# Patient Record
Sex: Male | Born: 1990 | Hispanic: No | Smoking: Former smoker
Health system: Southern US, Community
[De-identification: ages and names within clinical notes are randomized; demographics above are authoritative.]

## PROBLEM LIST (undated history)

## (undated) DIAGNOSIS — F250 Schizoaffective disorder, bipolar type: Secondary | ICD-10-CM

## (undated) DIAGNOSIS — F259 Schizoaffective disorder, unspecified: Secondary | ICD-10-CM

---

## 2017-01-09 ENCOUNTER — Other Ambulatory Visit: Payer: Self-pay

## 2017-01-09 ENCOUNTER — Encounter (HOSPITAL_COMMUNITY): Payer: Self-pay | Admitting: Emergency Medicine

## 2017-01-09 DIAGNOSIS — F1210 Cannabis abuse, uncomplicated: Secondary | ICD-10-CM | POA: Insufficient documentation

## 2017-01-09 DIAGNOSIS — Z139 Encounter for screening, unspecified: Secondary | ICD-10-CM | POA: Insufficient documentation

## 2017-01-09 DIAGNOSIS — Z87891 Personal history of nicotine dependence: Secondary | ICD-10-CM | POA: Insufficient documentation

## 2017-01-09 LAB — CBC
HCT: 41.9 % (ref 39.0–52.0)
Hemoglobin: 15.4 g/dL (ref 13.0–17.0)
MCH: 29.9 pg (ref 26.0–34.0)
MCHC: 36.8 g/dL — AB (ref 30.0–36.0)
MCV: 81.4 fL (ref 78.0–100.0)
PLATELETS: 263 10*3/uL (ref 150–400)
RBC: 5.15 MIL/uL (ref 4.22–5.81)
RDW: 12.1 % (ref 11.5–15.5)
WBC: 8.2 10*3/uL (ref 4.0–10.5)

## 2017-01-09 LAB — RAPID URINE DRUG SCREEN, HOSP PERFORMED
Amphetamines: NOT DETECTED
BARBITURATES: NOT DETECTED
BENZODIAZEPINES: NOT DETECTED
COCAINE: NOT DETECTED
OPIATES: NOT DETECTED
Tetrahydrocannabinol: NOT DETECTED

## 2017-01-09 LAB — COMPREHENSIVE METABOLIC PANEL
ALT: 15 U/L — AB (ref 17–63)
AST: 24 U/L (ref 15–41)
Albumin: 4.5 g/dL (ref 3.5–5.0)
Alkaline Phosphatase: 68 U/L (ref 38–126)
Anion gap: 10 (ref 5–15)
BILIRUBIN TOTAL: 0.8 mg/dL (ref 0.3–1.2)
BUN: 19 mg/dL (ref 6–20)
CALCIUM: 9.2 mg/dL (ref 8.9–10.3)
CO2: 25 mmol/L (ref 22–32)
CREATININE: 1.08 mg/dL (ref 0.61–1.24)
Chloride: 100 mmol/L — ABNORMAL LOW (ref 101–111)
Glucose, Bld: 100 mg/dL — ABNORMAL HIGH (ref 65–99)
Potassium: 3.5 mmol/L (ref 3.5–5.1)
Sodium: 135 mmol/L (ref 135–145)
TOTAL PROTEIN: 7.4 g/dL (ref 6.5–8.1)

## 2017-01-09 LAB — ETHANOL

## 2017-01-09 NOTE — ED Triage Notes (Addendum)
Patient presents to ED for a psychiatric evaluation. Patient states he is homeless and after talking to his friends, reports they suggested he talk to a professional. Patient calm and cooperative. Denies SI/HI. Patient states that he doesn't understand people the way a normal person would. He is very reflective on how he perceives things differently, but goes off on multiple tangents to where it no longer makes sense (flight of ideas).

## 2017-01-10 ENCOUNTER — Emergency Department (HOSPITAL_COMMUNITY)
Admission: EM | Admit: 2017-01-10 | Discharge: 2017-01-10 | Disposition: A | Discharge status: Home / Self Care | Payer: Self-pay | Attending: Emergency Medicine | Admitting: Emergency Medicine

## 2017-01-10 DIAGNOSIS — Z139 Encounter for screening, unspecified: Secondary | ICD-10-CM

## 2017-01-10 NOTE — ED Provider Notes (Signed)
[facility] Provider Note   CSN: [id number] Arrival date & time: 01/09/17  2203     History   Chief Complaint Chief Complaint  Patient presents with  . Psychiatric Evaluation    HPI Race Jordan Gomez is a 26 y.o. otherwise healthy male who presents to the ED requesting counseling services.  Patient states that his friends recommended that he get counseling, so he came here for psychiatric evaluation.  Patient denies any SI, HI, AVH, alcohol use, drug use, or cigarette use.  He has never seen a therapist before aside from when he was in school and had counseling through school.  He has never been diagnosed with any psychiatric conditions that he is aware of.  He denies any medical complaints, or any other complaints at this time. He came just because his friends thought he should get "checked" for his psychiatric wellbeing.    The history is provided by the patient and medical records. No language interpreter was used.    History reviewed. No pertinent past medical history.  There are no active problems to display for this patient.   History reviewed. No pertinent surgical history.     Home Medications    Prior to Admission medications   Not on File    Family History No family history on file.  Social History Social History   Tobacco Use  . Smoking status: Former Smoker    Types: Cigarettes  . Smokeless tobacco: Never Used  Substance Use Topics  . Alcohol use: Yes    Frequency: Never    Comment: occ  . Drug use: Yes    Types: Marijuana    Comment: every now and then     Allergies   Patient has no known allergies.   Review of Systems Review of Systems  Constitutional: Negative for chills and fever.  Respiratory: Negative for shortness of breath.   Cardiovascular: Negative for chest pain.  Gastrointestinal: Negative for abdominal pain, constipation, diarrhea, nausea and vomiting.  Genitourinary: Negative for  dysuria and hematuria.  Musculoskeletal: Negative for arthralgias and myalgias.  Skin: Negative for color change.  Allergic/Immunologic: Negative for immunocompromised state.  Neurological: Negative for weakness and numbness.  Psychiatric/Behavioral: Negative for confusion, hallucinations and suicidal ideas.   All other systems reviewed and are negative for acute change except as noted in the HPI.    Physical Exam Updated Vital Signs BP 112/71 (BP Location: Right Arm)   Pulse 86   Temp 98 F (36.7 C) (Oral)   Resp 18   SpO2 97%   Physical Exam  Constitutional: He is oriented to person, place, and time. Vital signs are normal. He appears well-developed and well-nourished.  Non-toxic appearance. No distress.  Afebrile, nontoxic, NAD  HENT:  Head: Normocephalic and atraumatic.  Mouth/Throat: Oropharynx is clear and moist and mucous membranes are normal.  Eyes: Conjunctivae and EOM are normal. Right eye exhibits no discharge. Left eye exhibits no discharge.  Neck: Normal range of motion. Neck supple.  Cardiovascular: Normal rate, regular rhythm, normal heart sounds and intact distal pulses. Exam reveals no gallop and no friction rub.  No murmur heard. Pulmonary/Chest: Effort normal and breath sounds normal. No respiratory distress. He has no decreased breath sounds. He has no wheezes. He has no rhonchi. He has no rales.  Abdominal: Soft. Normal appearance and bowel sounds are normal. He exhibits no distension. There is no tenderness. There is no rigidity, no rebound, no guarding, no CVA tenderness, no tenderness at  McBurney's point and negative Murphy's sign.  Musculoskeletal: Normal range of motion.  Neurological: He is alert and oriented to person, place, and time. He has normal strength. No sensory deficit.  Skin: Skin is warm, dry and intact. No rash noted.  Psychiatric: He has a normal mood and affect. His speech is normal and behavior is normal. He is not actively hallucinating. He  expresses no homicidal and no suicidal ideation. He expresses no suicidal plans and no homicidal plans.  Normal mood and affect, pleasant and cooperative. Clear thought content, behavior normal. Denies SI, HI, or AVH, doesn't seem to be responding to internal stimuli.   Nursing note and vitals reviewed.    ED Treatments / Results  Labs (all labs ordered are listed, but only abnormal results are displayed) Labs Reviewed  COMPREHENSIVE METABOLIC PANEL - Abnormal; Notable for the following components:      Result Value   Chloride 100 (*)    Glucose, Bld 100 (*)    ALT 15 (*)    All other components within normal limits  CBC - Abnormal; Notable for the following components:   MCHC 36.8 (*)    All other components within normal limits  ETHANOL  RAPID URINE DRUG SCREEN, HOSP PERFORMED    EKG  EKG Interpretation None       Radiology No results found.  Procedures Procedures (including critical care time)  Medications Ordered in ED Medications - No data to display   Initial Impression / Assessment and Plan / ED Course  I have reviewed the triage vital signs and the nursing notes.  Pertinent labs & imaging results that were available during my care of the patient were reviewed by me and considered in my medical decision making (see chart for details).     26 y.o. male here for psych eval because his friends said they thought he should get counseling. Denies any SI/HI/AVH, no drug/EtOH use, and nonsmoker. Denies medical complaints. Physical exam benign, clear thought process, no concerning findings. Labs done here are reassuring. Doubt acute emergent pathology or psychiatric emergency, will give resources for outpatient psychiatric evaluation. Advised f/up with [facility] in 1-2wks to establish medical care as well. I explained the diagnosis and have given explicit precautions to return to the ER including for any other new or worsening symptoms. The patient understands and accepts the  medical plan as it's been dictated and I have answered their questions. Discharge instructions concerning home care and prescriptions have been given. The patient is STABLE and is discharged to home in good condition.    Final Clinical Impressions(s) / ED Diagnoses   Final diagnoses:  Encounter for medical screening examination    ED Discharge Orders    [address]       Yusef Lamp, [place] GardensMercedes, [place] JerseyPA-C 01/10/17 0057    Dione BoozeGlick, David, MD 01/10/17 [phone]

## 2017-01-10 NOTE — ED Notes (Signed)
Discharge instructions and follow up care explained to pt. Pt denies that outpatient therapy will be effective. Refuses to take discharge paperwork with him and told this RN to throw it away.

## 2017-01-10 NOTE — Discharge Instructions (Signed)
[facility] to set up an appointment in the next 1-2 weeks to establish medical care. Use the list below to find outpatient psychiatric resources, call them today to set up ongoing outpatient therapies. Return to the ER for emergent changes or worsening symptoms.

## 2017-01-20 ENCOUNTER — Encounter (HOSPITAL_COMMUNITY): Payer: Self-pay | Admitting: Emergency Medicine

## 2017-01-20 DIAGNOSIS — Z7689 Persons encountering health services in other specified circumstances: Secondary | ICD-10-CM | POA: Insufficient documentation

## 2017-01-20 DIAGNOSIS — Z87891 Personal history of nicotine dependence: Secondary | ICD-10-CM | POA: Insufficient documentation

## 2017-01-20 LAB — CBC
HCT: 40.5 % (ref 39.0–52.0)
HEMOGLOBIN: 14.7 g/dL (ref 13.0–17.0)
MCH: 29.7 pg (ref 26.0–34.0)
MCHC: 36.3 g/dL — AB (ref 30.0–36.0)
MCV: 81.8 fL (ref 78.0–100.0)
Platelets: 250 10*3/uL (ref 150–400)
RBC: 4.95 MIL/uL (ref 4.22–5.81)
RDW: 12.2 % (ref 11.5–15.5)
WBC: 6.6 10*3/uL (ref 4.0–10.5)

## 2017-01-20 LAB — COMPREHENSIVE METABOLIC PANEL
ALK PHOS: 72 U/L (ref 38–126)
ALT: 29 U/L (ref 17–63)
ANION GAP: 8 (ref 5–15)
AST: 27 U/L (ref 15–41)
Albumin: 4.1 g/dL (ref 3.5–5.0)
BILIRUBIN TOTAL: 0.7 mg/dL (ref 0.3–1.2)
BUN: 13 mg/dL (ref 6–20)
CALCIUM: 9.2 mg/dL (ref 8.9–10.3)
CO2: 27 mmol/L (ref 22–32)
Chloride: 102 mmol/L (ref 101–111)
Creatinine, Ser: 0.87 mg/dL (ref 0.61–1.24)
GFR calc Af Amer: >60 mL/min (ref >60)
GLUCOSE: 111 mg/dL — AB (ref 65–99)
POTASSIUM: 3.4 mmol/L — AB (ref 3.5–5.1)
Sodium: 137 mmol/L (ref 135–145)
TOTAL PROTEIN: 7.1 g/dL (ref 6.5–8.1)

## 2017-01-20 LAB — RAPID URINE DRUG SCREEN, HOSP PERFORMED
AMPHETAMINES: NOT DETECTED
BARBITURATES: NOT DETECTED
BENZODIAZEPINES: NOT DETECTED
Cocaine: NOT DETECTED
Opiates: NOT DETECTED
Tetrahydrocannabinol: NOT DETECTED

## 2017-01-20 LAB — ETHANOL

## 2017-01-20 NOTE — ED Triage Notes (Addendum)
Pt to ED for psychiatric evaluation. Was seen in ED on the 6th of this month for same.. Denies any changes other than just getting out of jail, states "I want to catch it now if it is a psych problem, I need a real evaluation." Pt exhibits flight of ideas, appears calm and cooperative. Denies SI/HI, just "wants to talk to a psychiatrist." No other complaints. States they didn't do anything last time he was here.

## 2017-01-21 ENCOUNTER — Emergency Department (HOSPITAL_COMMUNITY)
Admission: EM | Admit: 2017-01-21 | Discharge: 2017-01-21 | Disposition: A | Discharge status: Home / Self Care | Payer: Self-pay | Attending: Emergency Medicine | Admitting: Emergency Medicine

## 2017-01-21 DIAGNOSIS — Z7689 Persons encountering health services in other specified circumstances: Secondary | ICD-10-CM

## 2017-01-21 NOTE — ED Notes (Signed)
RN explained discharge instructions to pt including list of resources provided.  Pt refused to sign discharge instructions, RN walked him to the lobby.  Pt was calm and cooperative.  NT brought him back thinking he was not suppose to leave b/c he was asking questions such as "what happens next".  RN once again attempted to explain however it was discovered that pt had thrown away discharge instructions.  RN offered to reprint resources.  Pt stated he did not need them and left.

## 2017-01-21 NOTE — ED Notes (Signed)
Pt requested to "just talk to the psychiatrist" and not this RN.  He was calm and cooperative otherwise.

## 2017-01-21 NOTE — BH Assessment (Addendum)
Tele Assessment Note   Patient Name: Jordan JumperRicardito Harold MRN: [id number] Referring Physician: Antony MaduraKelly Humes, PA Location of Patient: [facility] Location of Provider: Behavioral Health TTS Department   -Clinician reviewed note by Antony MaduraKelly Humes, PA.  Therman Elisabeth MostStevenson is an 26 y.o. male. 26 year old male presents to the emergency department for psychiatric evaluation.  He states that he wants to talk to a psychiatrist to determine if he is seeing or hearing anything that does not exist.  He denies ever being diagnosed with psychiatric illness.  No history of schizophrenia.  The patient has never taken any psychiatric medications.  He denies any suicidal or homicidal ideations.  No active hallucinations at this time.  Patient does not appear to be reacting to internal stimuli. Patient denies any SI, HI, AVH, alcohol use, drug use.  He was seen for similar symptoms on 01/09/2017  Patient says he came in because he sometimes wonders if what he is experiencing is real.  Patient talks at length about things that happened in high school.  He says that he does not see things but will reference having that experience later.  He denies hearing voices.  Patient denies any SI, plan or intention.  He denies any HI or plan either.  Patient denies any past suicide attempts.  He says he does not use drugs and the last time he drank was a few weeks ago.  Patient was released from jail yesterday (12/17).  He had been in for having been skateboarding on sidewalk.  -Clinician discussed patient care with Donell SievertSpencer Simon, PA who recommends observe until morning then discharge with outpatient resources.  Clinician informed Antony MaduraKelly Humes, PA of the disposition. She said that they had some outpatient resources to give patient.    Diagnosis:F31.74 Bipolar 1 d/o  Most recent episode manic  Past Medical History: History reviewed. No pertinent past medical history.  History reviewed. No pertinent surgical history.  Family History:  No family history on file.  Social History:  reports that he has quit smoking. His smoking use included cigarettes. he has never used smokeless tobacco. He reports that he drinks alcohol. He reports that he uses drugs. Drug: Marijuana.  Additional Social History:  Alcohol / Drug Use Pain Medications: None Prescriptions: NOne Over the Counter: None History of alcohol / drug use?: Yes(Has had no ETOH in months.)  CIWA: CIWA-Ar BP: (!) 98/56 Pulse Rate: 67 COWS:    PATIENT STRENGTHS: (choose at least two) Ability for insight Average or above average intelligence Capable of independent living Communication skills Motivation for treatment/growth  Allergies: No Known Allergies  Home Medications:  (Not in a hospital admission)  OB/GYN Status:  No LMP for male patient.  General Assessment Data [facility] Assessment: In system Is this a Tele or Face-to-Face Assessment?: Tele Assessment Is this an Initial Assessment or a Re-assessment for this encounter?: Initial Assessment Marital status: Single Is patient pregnant?: No Pregnancy Status: No Living Arrangements: Other (Comment)(Pt homeless.) Can pt return to current living arrangement?: Yes Admission Status: Voluntary Is patient capable of signing voluntary admission?: Yes Referral Source: Self/Family/Friend Insurance type: self pay     Crisis Care Plan Living Arrangements: Other (Comment)(Pt homeless.) Name of Psychiatrist: None Name of Therapist: None  Education Status Is patient currently in school?: No Highest grade of school patient has completed: High school  Risk to self with the past 6 months Suicidal Ideation: No Has patient been a risk to self within the past 6 months prior to admission? :  No Suicidal Intent: No Has patient had any suicidal intent within the past 6 months prior to admission? : No Is patient at risk for suicide?: No Suicidal Plan?: No Has patient had any suicidal  plan within the past 6 months prior to admission? : No Access to Means: No What has been your use of drugs/alcohol within the last 12 months?: ETOH a few months ago Previous Attempts/Gestures: No How many times?: 0 Other Self Harm Risks: None Triggers for Past Attempts: None known Intentional Self Injurious Behavior: None Family Suicide History: No Recent stressful life event(s): Financial Problems, Other (Comment)(Homelessness) Persecutory voices/beliefs?: Yes Depression: No Depression Symptoms: (Pt evidences no overt depressive symptoms.) Substance abuse history and/or treatment for substance abuse?: No Suicide prevention information given to non-admitted patients: Not applicable  Risk to Others within the past 6 months Homicidal Ideation: No Does patient have any lifetime risk of violence toward others beyond the six months prior to admission? : No Thoughts of Harm to Others: No Current Homicidal Intent: No Current Homicidal Plan: No Access to Homicidal Means: No Identified Victim: No one History of harm to others?: No Assessment of Violence: None Noted Violent Behavior Description: None reported Does patient have access to weapons?: No Criminal Charges Pending?: No Does patient have a court date: No Is patient on probation?: No  Psychosis Hallucinations: None noted Delusions: None noted  Mental Status Report Appearance/Hygiene: Unremarkable, In scrubs Eye Contact: Good Motor Activity: Freedom of movement, Unremarkable Speech: Incoherent Level of Consciousness: Alert Mood: Anxious Affect: Appropriate to circumstance Anxiety Level: Moderate Thought Processes: Irrelevant Judgement: Unimpaired Orientation: Appropriate for developmental age Obsessive Compulsive Thoughts/Behaviors: Minimal  Cognitive Functioning Concentration: Normal Memory: Recent Impaired, Remote Intact IQ: Average Insight: Fair Impulse Control: Good Appetite: Good Weight Loss: 0 Weight Gain:  0 Sleep: Decreased Total Hours of Sleep: (<6H/D) Vegetative Symptoms: Decreased grooming  ADLScreening [facility] Assessment Services) Patient's cognitive ability adequate to safely complete daily activities?: Yes Patient able to express need for assistance with ADLs?: Yes Independently performs ADLs?: Yes (appropriate for developmental age)  Prior Inpatient Therapy Prior Inpatient Therapy: No Prior Therapy Dates: N/A Prior Therapy Facilty/Provider(s): N/A Reason for Treatment: N/A  Prior Outpatient Therapy Prior Outpatient Therapy: Yes Prior Therapy Dates: when in school Prior Therapy Facilty/Provider(s): school counselor Reason for Treatment: counseling Does patient have an ACCT team?: No Does patient have Intensive In-House Services?  : No Does patient have [facility] services? : No Does patient have P4CC services?: No  ADL Screening (condition at time of admission) Patient's cognitive ability adequate to safely complete daily activities?: Yes Is the patient deaf or have difficulty hearing?: No Does the patient have difficulty seeing, even when wearing glasses/contacts?: No Does the patient have difficulty concentrating, remembering, or making decisions?: No Patient able to express need for assistance with ADLs?: Yes Does the patient have difficulty dressing or bathing?: No Independently performs ADLs?: Yes (appropriate for developmental age) Does the patient have difficulty walking or climbing stairs?: No Weakness of Legs: None Weakness of Arms/Hands: None       Abuse/Neglect Assessment (Assessment to be complete while patient is alone) Abuse/Neglect Assessment Can Be Completed: Yes Physical Abuse: Denies Verbal Abuse: Denies Sexual Abuse: Denies Exploitation of patient/patient's resources: Denies Self-Neglect: Denies     Merchant navy officerAdvance Directives (For Healthcare) Does Patient Have a Medical Advance Directive?: No Would patient like information on creating a medical advance  directive?: No - Patient declined    Additional Information 1:1 In Past 12 Months?: No CIRT Risk: No  Elopement Risk: No Does patient have medical clearance?: Yes     Disposition:  Disposition Initial Assessment Completed for this Encounter: Yes Disposition of Patient: Other dispositions Other disposition(s): Other (Comment)(Pt to be reviewed by PA)  This service was provided via telemedicine using a 2-way, interactive audio and video technology.  Names of all persons participating in this telemedicine service and their role in this encounter. Name Role:   Name:  Role:   Name:  Role:   Name: Role:     Alexandria Lodge 01/21/2017 4:20 AM

## 2017-01-21 NOTE — ED Notes (Signed)
Pt changed into burgundy scrubs, wanded.  Room checked for safety, in view of RN desk.

## 2017-01-21 NOTE — ED Provider Notes (Signed)
[facility] Provider Note   CSN: [id number] Arrival date & time: 01/20/17  2102     History   Chief Complaint Chief Complaint  Patient presents with  . Psychiatric Evaluation    HPI Jordan Gomez is a 26 y.o. male.  26 year old male presents to the emergency department for psychiatric evaluation.  He states that he wants to talk to a psychiatrist to determine if he is seeing or hearing anything that does not exist.  He denies ever being diagnosed with psychiatric illness.  No history of schizophrenia.  The patient has never taken any psychiatric medications.  He denies any suicidal or homicidal ideations.  No active hallucinations at this time.  Patient does not appear to be reacting to internal stimuli. Patient denies any SI, HI, AVH, alcohol use, drug use.  He was seen for similar symptoms on 01/09/2017.      History reviewed. No pertinent past medical history.  There are no active problems to display for this patient.   History reviewed. No pertinent surgical history.     Home Medications    Prior to Admission medications   Not on File    Family History No family history on file.  Social History Social History   Tobacco Use  . Smoking status: Former Smoker    Types: Cigarettes  . Smokeless tobacco: Never Used  Substance Use Topics  . Alcohol use: Yes    Frequency: Never    Comment: occ  . Drug use: Yes    Types: Marijuana    Comment: every now and then     Allergies   Patient has no known allergies.   Review of Systems Review of Systems Ten systems reviewed and are negative for acute change, except as noted in the HPI.    Physical Exam Updated Vital Signs BP (!) 100/54 (BP Location: Right Arm)   Pulse 66   Temp 98.1 F (36.7 C) (Oral)   Resp 18   Ht 5\' 4"  (1.626 m)   SpO2 99%   Physical Exam  Constitutional: He is oriented to person, place, and time. He appears well-developed and  well-nourished. No distress.  HENT:  Head: Normocephalic and atraumatic.  Eyes: Conjunctivae and EOM are normal. No scleral icterus.  Neck: Normal range of motion.  Pulmonary/Chest: Effort normal. No respiratory distress.  Musculoskeletal: Normal range of motion.  Neurological: He is alert and oriented to person, place, and time.  Skin: Skin is warm and dry. No rash noted. He is not diaphoretic. No erythema. No pallor.  Psychiatric: He is not actively hallucinating. He expresses no homicidal and no suicidal ideation.  Calm, cooperative  Nursing note and vitals reviewed.    ED Treatments / Results  Labs (all labs ordered are listed, but only abnormal results are displayed) Labs Reviewed  COMPREHENSIVE METABOLIC PANEL - Abnormal; Notable for the following components:      Result Value   Potassium 3.4 (*)    Glucose, Bld 111 (*)    All other components within normal limits  CBC - Abnormal; Notable for the following components:   MCHC 36.3 (*)    All other components within normal limits  ETHANOL  RAPID URINE DRUG SCREEN, HOSP PERFORMED    EKG  EKG Interpretation None       Radiology No results found.  Procedures Procedures (including critical care time)  Medications Ordered in ED Medications - No data to display   Initial Impression / Assessment and Plan /  ED Course  I have reviewed the triage vital signs and the nursing notes.  Pertinent labs & imaging results that were available during my care of the patient were reviewed by me and considered in my medical decision making (see chart for details).     26 year old male presents to the emergency department for psychiatric evaluation.  He denies suicidality as well as homicidal thoughts.  He has been medically cleared and evaluated by TTS.  No indication for behavioral health hospitalization.  TTS suggest resource guide for outpatient follow-up if desired.  Patient discharged in stable condition with no unaddressed  concerns.   Final Clinical Impressions(s) / ED Diagnoses   Final diagnoses:  Encounter for psychiatric assessment    ED Discharge Orders    None       Antony MaduraHumes, Krystalle Pilkington, PA-C 01/21/17 0536    Shon BatonHorton, Courtney F, MD 01/21/17 [phone]

## 2017-01-21 NOTE — ED Notes (Signed)
Provider bedside, informed RN he is going to be released.  RN returned clothing and belongings for pt to get dressed.

## 2017-02-01 ENCOUNTER — Other Ambulatory Visit: Payer: Self-pay

## 2017-02-01 ENCOUNTER — Encounter (HOSPITAL_COMMUNITY): Payer: Self-pay | Admitting: Emergency Medicine

## 2017-02-01 ENCOUNTER — Emergency Department (HOSPITAL_COMMUNITY)
Admission: EM | Admit: 2017-02-01 | Discharge: 2017-02-01 | Disposition: A | Discharge status: Home / Self Care | Payer: Self-pay | Attending: Emergency Medicine | Admitting: Emergency Medicine

## 2017-02-01 DIAGNOSIS — F4329 Adjustment disorder with other symptoms: Secondary | ICD-10-CM | POA: Diagnosis present

## 2017-02-01 DIAGNOSIS — Z87891 Personal history of nicotine dependence: Secondary | ICD-10-CM | POA: Insufficient documentation

## 2017-02-01 DIAGNOSIS — R440 Auditory hallucinations: Secondary | ICD-10-CM

## 2017-02-01 LAB — RAPID URINE DRUG SCREEN, HOSP PERFORMED
Amphetamines: NOT DETECTED
BARBITURATES: NOT DETECTED
Benzodiazepines: NOT DETECTED
Cocaine: NOT DETECTED
Opiates: NOT DETECTED
TETRAHYDROCANNABINOL: NOT DETECTED

## 2017-02-01 LAB — SALICYLATE LEVEL: Salicylate Lvl: <7 mg/dL (ref 2.8–30.0)

## 2017-02-01 LAB — COMPREHENSIVE METABOLIC PANEL
ALBUMIN: 4.6 g/dL (ref 3.5–5.0)
ALK PHOS: 85 U/L (ref 38–126)
ALT: 25 U/L (ref 17–63)
ANION GAP: 7 (ref 5–15)
AST: 28 U/L (ref 15–41)
BILIRUBIN TOTAL: 0.6 mg/dL (ref 0.3–1.2)
BUN: 21 mg/dL — AB (ref 6–20)
CALCIUM: 9.3 mg/dL (ref 8.9–10.3)
CO2: 27 mmol/L (ref 22–32)
CREATININE: 0.81 mg/dL (ref 0.61–1.24)
Chloride: 102 mmol/L (ref 101–111)
GFR calc Af Amer: >60 mL/min (ref >60)
GFR calc non Af Amer: >60 mL/min (ref >60)
GLUCOSE: 89 mg/dL (ref 65–99)
Potassium: 3.9 mmol/L (ref 3.5–5.1)
Sodium: 136 mmol/L (ref 135–145)
TOTAL PROTEIN: 7.8 g/dL (ref 6.5–8.1)

## 2017-02-01 LAB — CBC
HEMATOCRIT: 43.2 % (ref 39.0–52.0)
Hemoglobin: 15.4 g/dL (ref 13.0–17.0)
MCH: 29.8 pg (ref 26.0–34.0)
MCHC: 35.6 g/dL (ref 30.0–36.0)
MCV: 83.7 fL (ref 78.0–100.0)
Platelets: 238 10*3/uL (ref 150–400)
RBC: 5.16 MIL/uL (ref 4.22–5.81)
RDW: 12.6 % (ref 11.5–15.5)
WBC: 9.4 10*3/uL (ref 4.0–10.5)

## 2017-02-01 LAB — ACETAMINOPHEN LEVEL: Acetaminophen (Tylenol), Serum: <10 ug/mL — ABNORMAL LOW (ref 10–30)

## 2017-02-01 LAB — TSH: TSH: 1.402 u[IU]/mL (ref 0.350–4.500)

## 2017-02-01 LAB — ETHANOL: Alcohol, Ethyl (B): <10 mg/dL (ref <10)

## 2017-02-01 MED ORDER — ONDANSETRON HCL 4 MG PO TABS: 4.0000 mg | ORAL_TABLET | Freq: Three times a day (TID) | ORAL | Status: DC | PRN

## 2017-02-01 MED ORDER — ACETAMINOPHEN 325 MG PO TABS: 650.0000 mg | ORAL_TABLET | ORAL | Status: DC | PRN

## 2017-02-01 NOTE — [facility] Suicide Risk Assessment (Signed)
Suicide Risk Assessment  Discharge Assessment   [facility]   Principal Problem: Adjustment disorder with emotional disturbance Discharge Diagnoses:  Patient Active Problem List   Diagnosis Date Noted  . Adjustment disorder with emotional disturbance [F43.29] 02/01/2017    Priority: High    Total Time spent with patient: 45 minutes   Musculoskeletal: Strength & Muscle Tone: within normal limits Gait & Station: normal Patient leans: N/A  Psychiatric Specialty Exam:   Blood pressure (!) 109/54, pulse 83, temperature 97.8 F (36.6 C), temperature source Oral, resp. rate 16, height 5\' 5"  (1.651 m), weight 58.1 kg (128 lb), SpO2 100 %.Body mass index is 21.3 kg/m.  General Appearance: Casual  Eye Contact::  Good  Speech:  Normal Rate409  Volume:  Normal  Mood:  Euthymic  Affect:  Congruent  Thought Process:  Coherent and Descriptions of Associations: Intact  Orientation:  Full (Time, Place, and Person)  Thought Content:  WDL and Logical  Suicidal Thoughts:  No  Homicidal Thoughts:  No  Memory:  Immediate;   Good Recent;   Good Remote;   Good  Judgement:  Fair  Insight:  Fair  Psychomotor Activity:  Normal  Concentration:  Good  Recall:  Good  Fund of Knowledge:Fair  Language: Good  Akathisia:  No  Handed:  Right  AIMS (if indicated):     Assets:  Leisure Time Physical Health Resilience  Sleep:     Cognition: WNL  ADL's:  Intact   Mental Status Per Nursing Assessment::   On Admission:   26 yo male who presented to the ED with alleged voices but denied these to these providers.  No suicidal/homicidal ideations, hallucinations, or substance abuse.  He is homeless and wants to sleep.  Educated about outpatient resources but did not want to follow-up, he did want to sleep.  Stable for discharge.  Demographic Factors:  Male and Adolescent or young adult  Loss Factors: NA  Historical Factors: NA  Risk Reduction Factors:   Sense of  responsibility to family  Continued Clinical Symptoms:  None   Cognitive Features That Contribute To Risk:  None    Suicide Risk:  Minimal: No identifiable suicidal ideation.  Patients presenting with no risk factors but with morbid ruminations; may be classified as minimal risk based on the severity of the depressive symptoms    Plan Of Care/Follow-up recommendations:  Activity:  as tolerated Diet:  heart healthy diet  Nasiya Pascual, NP 02/01/2017, 12:34 PM

## 2017-02-01 NOTE — ED Notes (Signed)
Verified with lab that TSH has been added to blood in lab

## 2017-02-01 NOTE — ED Provider Notes (Signed)
[facility]-EMERGENCY DEPT Provider Note   CSN: [id number] Arrival date & time: 02/01/17  [id number]     History   Chief Complaint Chief Complaint  Patient presents with  . Medical Clearance    HPI Jordan Gomez is a 26 y.o. male.  The history is provided by the patient.  Mental Health Problem  Presenting symptoms: hallucinations   Presenting symptoms: no depression, no disorganized speech, no homicidal ideas, no suicidal thoughts, no suicidal threats and no suicide attempt   Presenting symptoms comment:  Wants evaluation to see if he is sane because he hears things that people he interacts with did not say and then sees a crowd when there wasn't one just minutes before.   Patient accompanied by: none. Degree of incapacity (severity):  Mild Onset quality:  Gradual Timing:  Constant Progression:  Unchanged Chronicity:  New Context: not noncompliant   Treatment compliance:  Untreated Relieved by:  Nothing Worsened by:  Nothing Ineffective treatments:  None tried Associated symptoms: no abdominal pain, no feelings of worthlessness and no insomnia   Risk factors: no hx of suicide attempts     History reviewed. No pertinent past medical history.  There are no active problems to display for this patient.   History reviewed. No pertinent surgical history.     Home Medications    Prior to Admission medications   Not on File    Family History History reviewed. No pertinent family history.  Social History Social History   Tobacco Use  . Smoking status: Former Smoker    Types: Cigarettes  . Smokeless tobacco: Never Used  Substance Use Topics  . Alcohol use: Yes    Frequency: Never    Comment: occ  . Drug use: Yes    Types: Marijuana    Comment: every now and then     Allergies   Patient has no known allergies.   Review of Systems Review of Systems  Constitutional: Negative for fever.  Eyes: Negative for visual disturbance.    Gastrointestinal: Negative for abdominal pain.  Psychiatric/Behavioral: Positive for hallucinations. Negative for decreased concentration, homicidal ideas and suicidal ideas. The patient does not have insomnia and is not hyperactive.   All other systems reviewed and are negative.    Physical Exam Updated Vital Signs BP 126/81 (BP Location: Right Arm)   Pulse 83   Temp 97.8 F (36.6 C) (Oral)   Resp 18   Ht 5\' 5"  (1.651 m)   Wt 58.1 kg (128 lb)   SpO2 95%   BMI 21.30 kg/m   Physical Exam  Constitutional: He is oriented to person, place, and time. He appears well-developed and well-nourished. No distress.  HENT:  Head: Normocephalic and atraumatic.  Mouth/Throat: No oropharyngeal exudate.  Eyes: Pupils are equal, round, and reactive to light.  Neck: Normal range of motion. Neck supple.  Cardiovascular: Normal rate, regular rhythm, normal heart sounds and intact distal pulses.  Pulmonary/Chest: Effort normal and breath sounds normal. No stridor.  Abdominal: Soft. Bowel sounds are normal. He exhibits no mass. There is no tenderness. There is no rebound and no guarding.  Musculoskeletal: Normal range of motion.  Neurological: He is alert and oriented to person, place, and time. He displays normal reflexes.  Skin: Skin is warm and dry. Capillary refill takes less than 2 seconds.  Psychiatric: He has a normal mood and affect.  Nursing note and vitals reviewed.    ED Treatments / Results   Vitals:   02/01/17 [id number]  BP: 126/81  Pulse: 83  Resp: 18  Temp: 97.8 F (36.6 C)  SpO2: 95%    Labs (all labs ordered are listed, but only abnormal results are displayed)  Results for orders placed or performed during the hospital encounter of 02/01/17  Comprehensive metabolic panel  Result Value Ref Range   Sodium 136 135 - 145 mmol/L   Potassium 3.9 3.5 - 5.1 mmol/L   Chloride 102 101 - 111 mmol/L   CO2 27 22 - 32 mmol/L   Glucose, Bld 89 65 - 99 mg/dL   BUN 21 (H) 6 - 20  mg/dL   Creatinine, Ser 0.980.81 0.61 - 1.24 mg/dL   Calcium 9.3 8.9 - 11.910.3 mg/dL   Total Protein 7.8 6.5 - 8.1 g/dL   Albumin 4.6 3.5 - 5.0 g/dL   AST 28 15 - 41 U/L   ALT 25 17 - 63 U/L   Alkaline Phosphatase 85 38 - 126 U/L   Total Bilirubin 0.6 0.3 - 1.2 mg/dL   GFR calc non Af Amer >60 >60 mL/min   GFR calc Af Amer >60 >60 mL/min   Anion gap 7 5 - 15  Ethanol  Result Value Ref Range   Alcohol, Ethyl (B) <10 <10 mg/dL  Salicylate level  Result Value Ref Range   Salicylate Lvl <7.0 2.8 - 30.0 mg/dL  Acetaminophen level  Result Value Ref Range   Acetaminophen (Tylenol), Serum <10 (L) 10 - 30 ug/mL  cbc  Result Value Ref Range   WBC 9.4 4.0 - 10.5 K/uL   RBC 5.16 4.22 - 5.81 MIL/uL   Hemoglobin 15.4 13.0 - 17.0 g/dL   HCT 14.743.2 82.939.0 - 56.252.0 %   MCV 83.7 78.0 - 100.0 fL   MCH 29.8 26.0 - 34.0 pg   MCHC 35.6 30.0 - 36.0 g/dL   RDW 13.012.6 86.511.5 - 78.415.5 %   Platelets 238 150 - 400 K/uL  Rapid urine drug screen (hospital performed)  Result Value Ref Range   Opiates NONE DETECTED NONE DETECTED   Cocaine NONE DETECTED NONE DETECTED   Benzodiazepines NONE DETECTED NONE DETECTED   Amphetamines NONE DETECTED NONE DETECTED   Tetrahydrocannabinol NONE DETECTED NONE DETECTED   Barbiturates NONE DETECTED NONE DETECTED   No results found.   Procedures Procedures (including critical care time)  Medications Ordered in ED Medications  ondansetron (ZOFRAN) tablet 4 mg (not administered)  acetaminophen (TYLENOL) tablet 650 mg (not administered)      Final Clinical Impressions(s) / ED Diagnoses   Final diagnoses:  Hearing voices   Medically cleared by me for TTS.  Dispo per them    Trai Ells, MD 02/01/17 [phone]

## 2017-02-01 NOTE — BH Assessment (Addendum)
Assessment Note  Jordan Gomez is an 26 y.o. male that presents this date for a psychiatric evaluation to "see if he is sane." Patient denies any S/I or H/I. Patient admits to active AVH stating he has been "passing people on the street who are saying he is dirty" and seeing traffic "that isn't there." Patient is very circumstantial and is focusing on certain questions this Clinical research associatewriter asking. Patient continues to be fixated on what "mental illness is." Patient is pleasant and oriented to time/place. Patient is wanting to "check to see if he is sane". Patient states "that one minute he is in a place that isn't crowded and then he turns around in a second and states it is crowded". Patient is displaying some flight of ideas during assessment but does not seem to be responding to internal stimuli. Patient denies any previous attempts/gestures at self harm or previous mental health diagnosis. Patient denies any current/past medication interventions to assist with any MH symptoms. Patient is very vague in reference to current symptoms and renders conflicting history. Patient reports a past history of Cannabis and alcohol use stating he "might had a couple beers last week" and cannot recall the last time he used any THC. Patient's UDS is negative this date. Patient denies any previous history of Inpatient/OP care although states he "saw a counselor just to talk when he was in high school." Patient states he is currently homeless and per chart review [facility] presenting with similar symptoms. Patient was discharged that date and advised to follow up with a OP provider which patient stated "he forgot to do." Patient states he does not know what to do when he hears voices or things "that don't look right". Per chart review patient presents to the emergency department for psychiatric evaluation. He states that he wants to talk to a psychiatrist to determine if he is seeing or hearing anything that  does not exist. He denies ever being diagnosed with psychiatric illness. No history of schizophrenia. The patient has never taken any psychiatric medications. He denies any suicidal or homicidal ideations. No active hallucinations at this time. Akintayo MD evalPatient will be discharged later this date with OP resources.    Diagnosis: F31.74 Bipolar 1 d/o  Most recent episode manic (per notes)  Past Medical History: History reviewed. No pertinent past medical history.  History reviewed. No pertinent surgical history.  Family History: History reviewed. No pertinent family history.  Social History:  reports that he has quit smoking. His smoking use included cigarettes. he has never used smokeless tobacco. He reports that he drinks alcohol. He reports that he uses drugs. Drug: Marijuana.  Additional Social History:  Alcohol / Drug Use Pain Medications: None Prescriptions: None Over the Counter: None History of alcohol / drug use?: Yes Longest period of sobriety (when/how long): Unknown Negative Consequences of Use: (Denies) Withdrawal Symptoms: (Denies) Substance #1 Name of Substance 1: Alcohol 1 - Age of First Use: 20 1 - Amount (size/oz): 12 oz beers 1 - Frequency: Pt is vague in reference to use states "maybe twice a month" 1 - Duration: Last year 1 - Last Use / Amount: Pt states "sometimes last week"   CIWA: CIWA-Ar BP: 109/87 Pulse Rate: 82 COWS:    Allergies: No Known Allergies  Home Medications:  (Not in a hospital admission)  OB/GYN Status:  No LMP for male patient.  General Assessment Data Location of Assessment: [facility] ED TTS Assessment: In system Is this a  Tele or Face-to-Face Assessment?: Face-to-Face Is this an Initial Assessment or a Re-assessment for this encounter?: Initial Assessment Marital status: Single Maiden name: NA Is patient pregnant?: No Pregnancy Status: No Living Arrangements: Alone(Homeless) Can pt return to current living arrangement?:  Yes Admission Status: Voluntary Is patient capable of signing voluntary admission?: Yes Referral Source: Self/Family/Friend Insurance type: Self Pay  Medical Screening Exam [facility] Walk-in ONLY) Medical Exam completed: Yes  Crisis Care Plan Living Arrangements: Alone(Homeless) Legal Guardian: (NA) Name of Psychiatrist: None Name of Therapist: None  Education Status Is patient currently in school?: No Current Grade: (NA) 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 : No Suicidal Intent: No Has patient had any suicidal intent within the past 6 months prior to admission? : No Is patient at risk for suicide?: No Suicidal Plan?: No Has patient had any suicidal plan within the past 6 months prior to admission? : No Access to Means: No What has been your use of drugs/alcohol within the last 12 months?: Some alcohol use  Previous Attempts/Gestures: No How many times?: 0 Other Self Harm Risks: None Triggers for Past Attempts: Unknown Intentional Self Injurious Behavior: None Family Suicide History: No Recent stressful life event(s): (Homeless) Persecutory voices/beliefs?: Yes Depression: No Depression Symptoms: (NA) Substance abuse history and/or treatment for substance abuse?: No Suicide prevention information given to non-admitted patients: Not applicable  Risk to Others within the past 6 months Homicidal Ideation: No Does patient have any lifetime risk of violence toward others beyond the six months prior to admission? : No Thoughts of Harm to Others: No Current Homicidal Intent: No Current Homicidal Plan: No Access to Homicidal Means: No Identified Victim: NA History of harm to others?: No Assessment of Violence: None Noted Violent Behavior Description: NA Does patient have  access to weapons?: No Criminal Charges Pending?: No Does patient have a court date: No Is patient on probation?: No  Psychosis Hallucinations: None noted Delusions: None noted  Mental Status Report Appearance/Hygiene: Unremarkable Eye Contact: Good Motor Activity: Freedom of movement Speech: Unremarkable Level of Consciousness: Alert Mood: Pleasant Affect: Appropriate to circumstance Anxiety Level: Minimal Thought Processes: Flight of Ideas Judgement: Unimpaired Orientation: Person, Place, Time Obsessive Compulsive Thoughts/Behaviors: None  Cognitive Functioning Concentration: Normal Memory: Recent Intact, Remote Intact IQ: Average Insight: Fair Impulse Control: Good Appetite: Fair Weight Loss: 0 Weight Gain: 0 Sleep: No Change Total Hours of Sleep: 6 Vegetative Symptoms: None  ADLScreening [facility] Assessment Services) Patient's cognitive ability adequate to safely complete daily activities?: Yes Patient able to express need for assistance with ADLs?: Yes Independently performs ADLs?: Yes (appropriate for developmental age)  Prior Inpatient Therapy Prior Inpatient Therapy: No Prior Therapy Dates: NA Prior Therapy Facilty/Provider(s): NA Reason for Treatment: NA  Prior Outpatient Therapy Prior Outpatient Therapy: Yes Prior Therapy Dates: Past hx Prior Therapy Facilty/Provider(s): Counselor Reason for Treatment: Counseling Does patient have an ACCT team?: No Does patient have Intensive In-House Services?  : No Does patient have [facility] services? : No Does patient have [facility] services?: No  ADL Screening (condition at time of admission) Patient's cognitive ability adequate to safely complete daily activities?: Yes Is the patient deaf or have difficulty hearing?: No Does the patient have difficulty seeing, even when wearing glasses/contacts?: No Does the patient have difficulty concentrating, remembering, or making decisions?:  No Patient able to express need for  assistance with ADLs?: Yes Does the patient have difficulty dressing or bathing?: No Independently performs ADLs?: Yes (appropriate for developmental age) Does the patient have difficulty walking or climbing stairs?: No Weakness of Legs: None Weakness of Arms/Hands: None  Home Assistive Devices/Equipment Home Assistive Devices/Equipment: None  Therapy Consults (therapy consults require a physician order) PT Evaluation Needed: No OT Evalulation Needed: No SLP Evaluation Needed: No Abuse/Neglect Assessment (Assessment to be complete while patient is alone) Physical Abuse: Denies Verbal Abuse: Denies Sexual Abuse: Denies Exploitation of patient/patient's resources: Denies Self-Neglect: Denies Values / Beliefs Cultural Requests During Hospitalization: None Spiritual Requests During Hospitalization: None Consults Spiritual Care Consult Needed: No Social Work Consult Needed: No Merchant navy officerAdvance Directives (For Healthcare) Does Patient Have a Medical Advance Directive?: No Would patient like information on creating a medical advance directive?: No - Patient declined    Additional Information 1:1 In Past 12 Months?: No CIRT Risk: No Elopement Risk: No Does patient have medical clearance?: Yes     Disposition:  Disposition Initial Assessment Completed for this Encounter: Yes Disposition of Patient: Other dispositions Other disposition(s): Other (Comment)(Pt to be seen by psychiatry)  On Site Evaluation by:   Reviewed with Physician:    Alfredia Fergusonavid L Kataleyah Carducci 02/01/2017 9:27 AM

## 2017-02-01 NOTE — ED Notes (Signed)
[id number] Expected date:  Expected time:  Means of arrival:  Comments: Margo AyeHall c

## 2017-02-01 NOTE — ED Notes (Signed)
Pt says that he might see things or hear things that aren't there, and wants to get a mental health evaluation. Pt stated no HI or SI.

## 2017-02-01 NOTE — ED Notes (Signed)
SECURITY CLEARED BEFORE TRANSFER

## 2017-02-01 NOTE — ED Triage Notes (Addendum)
Patient is wanting to check to see if he is sane. Patient states that one minute a place is crowded and he turn around in a second and patient states it is crowded. Patient denies si or hi. Patient states he is not on medication. He states he doesn't want to hurt someone today. Patient states he does not know what to do when he hears voices or things dont look right.

## 2017-02-01 NOTE — ED Notes (Addendum)
Written dc instructions reviewed with pt, [facility] information given.  [facility] or at other OP referrals he has been given.  Pt reported that he has been given a list of community referrals .  Pt also reported that he was not going to follow up for OP services and declined to take the written dc instructions or [facility] information reviewed by this writer(throw them out").  Pt ambulatory w/o difficulty to dc area w/ this Clinical research associatewriter, belongings returned after leaving the area.

## 2017-02-01 NOTE — ED Notes (Signed)
Up to the bathroom 

## 2017-02-01 NOTE — Progress Notes (Signed)
26 yo male who presented to the ED with alleged voices but denied these to these providers.  No suicidal/homicidal ideations, hallucinations, or substance abuse.  He is homeless and wants to sleep.  Educated about outpatient resources but did not want to follow-up, he did want to sleep.  Stable for discharge.  Jordan Gomez, PMHNP

## 2017-02-01 NOTE — ED Notes (Signed)
tts into see 

## 2017-05-06 ENCOUNTER — Encounter (HOSPITAL_COMMUNITY): Payer: Self-pay

## 2017-05-06 ENCOUNTER — Emergency Department (HOSPITAL_COMMUNITY)
Admission: EM | Admit: 2017-05-06 | Discharge: 2017-05-08 | Disposition: A | Discharge status: Other Acute Inpatient Hospital | Payer: Self-pay | Attending: Emergency Medicine | Admitting: Emergency Medicine

## 2017-05-06 DIAGNOSIS — Z87891 Personal history of nicotine dependence: Secondary | ICD-10-CM | POA: Insufficient documentation

## 2017-05-06 DIAGNOSIS — F29 Unspecified psychosis not due to a substance or known physiological condition: Secondary | ICD-10-CM | POA: Insufficient documentation

## 2017-05-06 LAB — RAPID URINE DRUG SCREEN, HOSP PERFORMED
AMPHETAMINES: NOT DETECTED
Barbiturates: NOT DETECTED
Benzodiazepines: NOT DETECTED
COCAINE: NOT DETECTED
OPIATES: NOT DETECTED
TETRAHYDROCANNABINOL: POSITIVE — AB

## 2017-05-06 LAB — CBC WITH DIFFERENTIAL/PLATELET
BASOS ABS: 0 10*3/uL (ref 0.0–0.1)
BASOS PCT: 0 %
Eosinophils Absolute: 0.1 10*3/uL (ref 0.0–0.7)
Eosinophils Relative: 1 %
HCT: 42.9 % (ref 39.0–52.0)
HEMOGLOBIN: 15.4 g/dL (ref 13.0–17.0)
LYMPHS PCT: 16 %
Lymphs Abs: 1.3 10*3/uL (ref 0.7–4.0)
MCH: 30 pg (ref 26.0–34.0)
MCHC: 35.9 g/dL (ref 30.0–36.0)
MCV: 83.5 fL (ref 78.0–100.0)
MONO ABS: 0.5 10*3/uL (ref 0.1–1.0)
Monocytes Relative: 7 %
NEUTROS PCT: 76 %
Neutro Abs: 6.4 10*3/uL (ref 1.7–7.7)
Platelets: 248 10*3/uL (ref 150–400)
RBC: 5.14 MIL/uL (ref 4.22–5.81)
RDW: 12.1 % (ref 11.5–15.5)
WBC: 8.4 10*3/uL (ref 4.0–10.5)

## 2017-05-06 LAB — COMPREHENSIVE METABOLIC PANEL
ALBUMIN: 4.1 g/dL (ref 3.5–5.0)
ALT: 18 U/L (ref 17–63)
AST: 33 U/L (ref 15–41)
Alkaline Phosphatase: 80 U/L (ref 38–126)
Anion gap: 16 — ABNORMAL HIGH (ref 5–15)
BILIRUBIN TOTAL: 0.7 mg/dL (ref 0.3–1.2)
BUN: 20 mg/dL (ref 6–20)
CALCIUM: 8.9 mg/dL (ref 8.9–10.3)
CO2: 19 mmol/L — ABNORMAL LOW (ref 22–32)
CREATININE: 1.02 mg/dL (ref 0.61–1.24)
Chloride: 103 mmol/L (ref 101–111)
GFR calc Af Amer: >60 mL/min (ref >60)
GLUCOSE: 84 mg/dL (ref 65–99)
POTASSIUM: 3.5 mmol/L (ref 3.5–5.1)
Sodium: 138 mmol/L (ref 135–145)
TOTAL PROTEIN: 7.7 g/dL (ref 6.5–8.1)

## 2017-05-06 LAB — ETHANOL: Alcohol, Ethyl (B): 116 mg/dL — ABNORMAL HIGH (ref <10)

## 2017-05-06 LAB — ACETAMINOPHEN LEVEL

## 2017-05-06 LAB — SALICYLATE LEVEL: Salicylate Lvl: <7 mg/dL (ref 2.8–30.0)

## 2017-05-06 MED ORDER — VITAMIN B-1 100 MG PO TABS
100.0000 mg | ORAL_TABLET | Freq: Every day | ORAL | Status: DC
  Administered 2017-05-08: 100 mg via ORAL
  Filled 2017-05-06 (×3): qty 1

## 2017-05-06 MED ORDER — LORAZEPAM 1 MG PO TABS: 0.0000 mg | ORAL_TABLET | Freq: Two times a day (BID) | ORAL | Status: DC

## 2017-05-06 MED ORDER — LORAZEPAM 1 MG PO TABS
1.0000 mg | ORAL_TABLET | Freq: Four times a day (QID) | ORAL | Status: DC | PRN
  Administered 2017-05-06: 1 mg via ORAL
  Filled 2017-05-06: qty 1

## 2017-05-06 MED ORDER — LORAZEPAM 2 MG/ML IJ SOLN: 0.0000 mg | Freq: Two times a day (BID) | INTRAMUSCULAR | Status: DC

## 2017-05-06 MED ORDER — LORAZEPAM 2 MG/ML IJ SOLN: 0.0000 mg | Freq: Four times a day (QID) | INTRAMUSCULAR | Status: DC

## 2017-05-06 MED ORDER — THIAMINE HCL 100 MG/ML IJ SOLN: 100.0000 mg | Freq: Every day | INTRAMUSCULAR | Status: DC

## 2017-05-06 MED ORDER — LORAZEPAM 1 MG PO TABS
0.0000 mg | ORAL_TABLET | Freq: Four times a day (QID) | ORAL | Status: DC
  Administered 2017-05-07: 1 mg via ORAL
  Filled 2017-05-06 (×2): qty 1

## 2017-05-06 NOTE — ED Notes (Signed)
2 white patient belonging bags placed in TCU locker. One bag has necklace, earphones, bandaids, and the other had shoes, socks, underwear, pants, shirt

## 2017-05-06 NOTE — ED Notes (Signed)
Bed: [facility] Expected date:  Expected time:  Means of arrival:  Comments: No bed 

## 2017-05-06 NOTE — BH Assessment (Addendum)
Assessment Note  Jordan Gomez is an 27 y.o. male.  The pt came in after he was seen running in the street and acting in a bizarre way.  The pt was also drinking and had a blood alcohol level of 116.  The pt stated he normally does not drink.  The pt is not a good historian and it was hard to follow the pt's train of thought when he spoke.  The pt also did not answer some of the questions or answered the questions in a bizarre way.  When asked about his sleep, the pt stated, "not enough to see God."  He stated, "Jordan Gomez is his new identity".  The pt talked about not knowing if things were real or if his friends were really his friends or something else.  When asked about hallucinations, he responded, "hospitals don't know they are hospitals"  The pt stated he wants to understand who he is.  It is believed that the pt is homeless.  When asked if he was homeless he stated he wasn't homeless and then stated he lives with homeless people.  The pt denies doing any drugs. "I stay away from drugs".  His UDS was positive for marijuana.   The pt denies being hospitalized for mental reasons in the past.  He has had previous ED visits, but has not been hospitalized.  He has not followed up with seeing an out patient counselor.  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 No pertinent past medical history.  History reviewed. No pertinent surgical history.  Family History: No family history on file.  Social History:  reports that he has quit smoking. His smoking use included cigarettes. He has never used smokeless tobacco. He reports that he drinks alcohol. He reports that he has current or past drug history. Drug: Marijuana.  Additional Social History:  Alcohol / Drug Use Pain Medications: See MAR Prescriptions: See MAR Over the Counter: See MAR History of alcohol / drug use?: Yes Longest  period of sobriety (when/how long): unable to assess Substance #1 Name of Substance 1: alcohol 1 - Last Use / Amount: 05/06/2017  CIWA: CIWA-Ar BP: 124/66 Pulse Rate: 85 Nausea and Vomiting: no nausea and no vomiting Tactile Disturbances: none Tremor: no tremor Auditory Disturbances: not present Paroxysmal Sweats: two Visual Disturbances: not present Anxiety: three Headache, Fullness in Head: none present Agitation: somewhat more than normal activity Orientation and Clouding of Sensorium: oriented and can do serial additions CIWA-Ar Total: 6 COWS:    Allergies: No Known Allergies  Home Medications:  (Not in a hospital admission)  OB/GYN Status:  No LMP for male patient.  General Assessment Data [facility] of [facility]: [facility] Assessment: In system Is this a Tele or Face-to-Face Assessment?: Face-to-Face Is this an Initial Assessment or a Re-assessment for this encounter?: Initial Assessment Marital status: Single Maiden name: NA Is patient pregnant?: Other (Comment)(male) Living Arrangements: Other (Comment)(homeless) Can pt return to current living arrangement?: Yes Admission Status: Involuntary Is patient capable of signing voluntary admission?: No Referral Source: Self/Family/Friend Insurance type: Self pay     Crisis Care Plan Living Arrangements: Other (Comment)(homeless) Legal Guardian: Other:(Self) Name of Psychiatrist: none Name of Therapist: none  Education Status Is patient currently in school?: No Is the patient employed, unemployed or receiving disability?: Unemployed  Risk to self with the past 6 months Suicidal Ideation: No Has patient been  a risk to self within the past 6 months prior to admission? : No Suicidal Intent: No Has patient had any suicidal intent within the past 6 months prior to admission? : No Is patient at risk for suicide?: No Suicidal Plan?: No Has patient had any suicidal plan within the past 6 months prior to admission? :  No Access to Means: No What has been your use of drugs/alcohol within the last 12 months?: alcohol use today Previous Attempts/Gestures: No How many times?: 0 Other Self Harm Risks: none Triggers for Past Attempts: None known Intentional Self Injurious Behavior: None Family Suicide History: No Recent stressful life event(s): Other (Comment)("dont know what it is to be me") Persecutory voices/beliefs?: No Depression: No Depression Symptoms: Insomnia Substance abuse history and/or treatment for substance abuse?: No Suicide prevention information given to non-admitted patients: Not applicable  Risk to Others within the past 6 months Homicidal Ideation: No Does patient have any lifetime risk of violence toward others beyond the six months prior to admission? : No Thoughts of Harm to Others: No Current Homicidal Intent: No Current Homicidal Plan: No Access to Homicidal Means: No Identified Victim: none History of harm to others?: No Assessment of Violence: None Noted Violent Behavior Description: none Does patient have access to weapons?: No Criminal Charges Pending?: No Does patient have a court date: No Is patient on probation?: No  Psychosis Hallucinations: None noted Delusions: None noted  Mental Status Report Appearance/Hygiene: Body odor, In scrubs Eye Contact: Fair Motor Activity: Unable to assess Speech: Incoherent Level of Consciousness: Alert Mood: Pleasant Affect: Appropriate to circumstance Anxiety Level: None Thought Processes: Flight of Ideas Judgement: Impaired Obsessive Compulsive Thoughts/Behaviors: None  Cognitive Functioning Concentration: Decreased Memory: Recent Intact, Remote Intact Is patient IDD: No Is patient DD?: No Insight: Poor Impulse Control: Poor Appetite: Poor Have you had any weight changes? : No Change Sleep: Unable to Assess Vegetative Symptoms: None  ADLScreening [facility] Assessment Services) Patient's cognitive ability adequate  to safely complete daily activities?: Yes Patient able to express need for assistance with ADLs?: Yes Independently performs ADLs?: Yes (appropriate for developmental age)  Prior Inpatient Therapy Prior Inpatient Therapy: No  Prior Outpatient Therapy Prior Outpatient Therapy: No Does patient have an ACCT team?: No Does patient have Intensive In-House Services?  : No Does patient have [facility] services? : No Does patient have P4CC services?: No  ADL Screening (condition at time of admission) Patient's cognitive ability adequate to safely complete daily activities?: Yes Patient able to express need for assistance with ADLs?: Yes Independently performs ADLs?: Yes (appropriate for developmental age)       Abuse/Neglect Assessment (Assessment to be complete while patient is alone) Abuse/Neglect Assessment Can Be Completed: Yes Physical Abuse: Denies Verbal Abuse: Denies Sexual Abuse: Denies Exploitation of patient/patient's resources: Denies Values / Beliefs Cultural Requests During Hospitalization: None Spiritual Requests During Hospitalization: None Consults Spiritual Care Consult Needed: No Social Work Consult Needed: No            Disposition:  Disposition Initial Assessment Completed for this Encounter: Yes   PA Donell Sievert recommends inpatient treatment.  MD Freida Busman was made aware of the recommendation.  On Site Evaluation by:   Reviewed with Physician:    Ottis Stain 05/06/2017 11:06 PM

## 2017-05-06 NOTE — ED Provider Notes (Signed)
[facility]-EMERGENCY DEPT Provider Note   CSN: [id number] Arrival date & time: 05/06/17  1841     History   Chief Complaint Chief Complaint  Patient presents with  . Medical Clearance    pt found by [employer] running in street, manic behavior, flight of thoughts, gave foot persuit to police, police obtaining IVC    HPI Jordan Gomez is a 27 y.o. male.  27 year old male with history of adjustment disorder found by bystanders running around today with bizarre behavior.  According to police, they try to communicate with the patient and he ran towards him and then a foot chase started.  Patient admits to drinking alcohol with caffeine.  Denies illicit drug use.  He states that he is hearing voices as well as appears to be responding to internal stimuli.  Denies any suicidal or homicidal ideations.  History is limited due to his current state.     History reviewed. No pertinent past medical history.  Patient Active Problem List   Diagnosis Date Noted  . Adjustment disorder with emotional disturbance 02/01/2017    History reviewed. No pertinent surgical history.      Home Medications    Prior to Admission medications   Not on File    Family History No family history on file.  Social History Social History   Tobacco Use  . Smoking status: Former Smoker    Types: Cigarettes  . Smokeless tobacco: Never Used  Substance Use Topics  . Alcohol use: Yes    Frequency: Never    Comment: unable to answer  . Drug use: Yes    Types: Marijuana    Comment: every now and then, THC     Allergies   Patient has no known allergies.   Review of Systems Review of Systems  Unable to perform ROS: Psychiatric disorder     Physical Exam Updated Vital Signs BP 124/66 (BP Location: Right Arm)   Pulse 85   Temp 98.9 F (37.2 C) (Oral)   SpO2 98%   Physical Exam  Constitutional: He is oriented to person, place, and time. He appears well-developed and  well-nourished.  Non-toxic appearance. No distress.  HENT:  Head: Normocephalic and atraumatic.  Eyes: Pupils are equal, round, and reactive to light. Conjunctivae, EOM and lids are normal.  Neck: Normal range of motion. Neck supple. No tracheal deviation present. No thyroid mass present.  Cardiovascular: Normal rate, regular rhythm and normal heart sounds. Exam reveals no gallop.  No murmur heard. Pulmonary/Chest: Effort normal and breath sounds normal. No stridor. No respiratory distress. He has no decreased breath sounds. He has no wheezes. He has no rhonchi. He has no rales.  Abdominal: Soft. Normal appearance and bowel sounds are normal. He exhibits no distension. There is no tenderness. There is no rebound and no CVA tenderness.  Musculoskeletal: Normal range of motion. He exhibits no edema or tenderness.  Neurological: He is alert and oriented to person, place, and time. He has normal strength. No cranial nerve deficit or sensory deficit. GCS eye subscore is 4. GCS verbal subscore is 5. GCS motor subscore is 6.  Skin: Skin is warm and dry. No abrasion and no rash noted.  Psychiatric: His mood appears anxious. His affect is labile. His speech is rapid and/or pressured. He is hyperactive and actively hallucinating. Thought content is paranoid. He expresses impulsivity. He expresses no suicidal plans and no homicidal plans. He is inattentive.  Nursing note and vitals reviewed.    ED  Treatments / Results  Labs (all labs ordered are listed, but only abnormal results are displayed) Labs Reviewed  RAPID URINE DRUG SCREEN, HOSP PERFORMED  ETHANOL  SALICYLATE LEVEL  ACETAMINOPHEN LEVEL  CBC WITH DIFFERENTIAL/PLATELET  COMPREHENSIVE METABOLIC PANEL    EKG None  Radiology No results found.  Procedures Procedures (including critical care time)  Medications Ordered in ED Medications - No data to display   Initial Impression / Assessment and Plan / ED Course  I have reviewed the  triage vital signs and the nursing notes.  Pertinent labs & imaging results that were available during my care of the patient were reviewed by me and considered in my medical decision making (see chart for details).     8:26 PM Patient placed under IVC by the police.  Patient medically cleared for psychiatric disposition  Final Clinical Impressions(s) / ED Diagnoses   Final diagnoses:  None    ED Discharge Orders    None       Lorre Nick, MD 05/06/17 2026

## 2017-05-06 NOTE — ED Triage Notes (Signed)
Pt found by [employer] on [address]/ [place] , running in street, Pt is hyper vocal, flight of thoughts, inappropriate with responses.  Pt is homeless, states " I have been everywhere, and no where, My name doesn't mean anything to me, I don't know my name, or where I come from.  [place] means more to me that a name in public" . " I have every idea why I am here, Im not dead yet./  I want to take these people out of their skins and have their jobs, and then I would find a means to an end".   When asked if he has ever tried to commit suicide he replied" maybe someone else tried with my body and not me. I dont know why their are marks on my body, someone else must have been inside my body". " I should be inside another persons body right now"

## 2017-05-06 NOTE — ED Notes (Signed)
Patient admitted on unit, anxious and hyperactive. Speech incorhorent, tangential, disorganized and flight of ideas. Patient appear to be responding to internal stimuli as patient tend to laugh and talk to himself. Occasionally screams which he attributes to "relieve pain".  When asked patient where he feels the pain, patient stated "not physical. There is no pain". Patient denies active SI/HI, AH/VH. Will continue to monitor patient.

## 2017-05-07 MED ORDER — RISPERIDONE 1 MG PO TABS
1.0000 mg | ORAL_TABLET | Freq: Two times a day (BID) | ORAL | Status: DC
  Filled 2017-05-07: qty 1

## 2017-05-07 NOTE — ED Notes (Signed)
Pt is calm and stays in his room most of the time. He took a shower this morning. He refuses medications. States that he is feeling better now.

## 2017-05-07 NOTE — Progress Notes (Signed)
[facility] called and stated pt is declined[facility] due to pt's noncompliance with antipsychotics.    Please reconsult if future social work needs arise.  CSW signing off, as social work intervention is no longer needed.  Dorothe PeaJonathan F. Amie Cowens, LCSW, LCAS, CSI Clinical Social Worker Ph: [phone]

## 2017-05-07 NOTE — BH Assessment (Signed)
[facility]   Per Juanetta BeetsJacqueline Norman, DO, this pt requires psychiatric hospitalization at this time.  Pt presents under IVC initiated by law enforcement, which Dr Sharma CovertNorman has upheld.  The following facilities have been contacted to seek placement for this pt, with results as noted:  Beds available, information sent, decision pending:  [facility]   At capacity:  [facility]    Enriqueta [place], [place] [id number] Health Coordinator [phone]

## 2017-05-07 NOTE — ED Notes (Signed)
SBAR Report received from previous nurse. Pt received calm and visible on unit. Pt denies current SI/ HI, A/V H, depression, anxiety, or pain at this time, and appears otherwise stable and free of distress. Pt reminded of camera surveillance, q 15 min rounds, and rules of the milieu. Will continue to assess. 

## 2017-05-08 ENCOUNTER — Other Ambulatory Visit: Payer: Self-pay

## 2017-05-08 ENCOUNTER — Encounter (HOSPITAL_COMMUNITY): Payer: Self-pay | Admitting: *Deleted

## 2017-05-08 ENCOUNTER — Inpatient Hospital Stay (HOSPITAL_COMMUNITY)
Admission: AD | Admit: 2017-05-08 | Discharge: 2017-05-13 | DRG: 885 | Disposition: A | Discharge status: Home / Self Care | Payer: No Typology Code available for payment source | Source: Intra-hospital | Attending: Psychiatry | Admitting: Psychiatry

## 2017-05-08 DIAGNOSIS — Z87891 Personal history of nicotine dependence: Secondary | ICD-10-CM

## 2017-05-08 DIAGNOSIS — Y905 Blood alcohol level of 100-119 mg/100 ml: Secondary | ICD-10-CM | POA: Diagnosis present

## 2017-05-08 DIAGNOSIS — F23 Brief psychotic disorder: Principal | ICD-10-CM | POA: Diagnosis present

## 2017-05-08 DIAGNOSIS — F10129 Alcohol abuse with intoxication, unspecified: Secondary | ICD-10-CM | POA: Diagnosis present

## 2017-05-08 DIAGNOSIS — F419 Anxiety disorder, unspecified: Secondary | ICD-10-CM | POA: Diagnosis present

## 2017-05-08 DIAGNOSIS — F29 Unspecified psychosis not due to a substance or known physiological condition: Secondary | ICD-10-CM | POA: Diagnosis not present

## 2017-05-08 DIAGNOSIS — R450 Nervousness: Secondary | ICD-10-CM

## 2017-05-08 DIAGNOSIS — F1290 Cannabis use, unspecified, uncomplicated: Secondary | ICD-10-CM

## 2017-05-08 DIAGNOSIS — R451 Restlessness and agitation: Secondary | ICD-10-CM | POA: Diagnosis present

## 2017-05-08 DIAGNOSIS — Z590 Homelessness: Secondary | ICD-10-CM

## 2017-05-08 DIAGNOSIS — G4700 Insomnia, unspecified: Secondary | ICD-10-CM | POA: Diagnosis present

## 2017-05-08 MED ORDER — RISPERIDONE 1 MG PO TABS
1.0000 mg | ORAL_TABLET | Freq: Two times a day (BID) | ORAL | Status: DC
  Filled 2017-05-08 (×6): qty 1

## 2017-05-08 MED ORDER — MAGNESIUM HYDROXIDE 400 MG/5ML PO SUSP: 30.0000 mL | Freq: Every day | ORAL | Status: DC | PRN

## 2017-05-08 MED ORDER — ACETAMINOPHEN 325 MG PO TABS: 650.0000 mg | ORAL_TABLET | Freq: Four times a day (QID) | ORAL | Status: DC | PRN

## 2017-05-08 MED ORDER — ALUM & MAG HYDROXIDE-SIMETH 200-200-20 MG/5ML PO SUSP: 30.0000 mL | ORAL | Status: DC | PRN

## 2017-05-08 NOTE — ED Notes (Signed)
This nurse assuming care of pt. Pt pleasant on approach, thought blocking noted, Lunch tray provided. Encouragement and support provided. Special checks q 15 mins in place for safety, video monitoring in place. Will continue to monitor.

## 2017-05-08 NOTE — Progress Notes (Signed)
Jordan Gomez is a 27 year old male pt admitted on involuntary basis. On admission, he appears pleasant and anxious with tangential thought process. He denies any SI and is able to contract for safety while in the hospital. He spoke about how he tried to help someone across the street and the police came to talk to him, he then ran but the police caught him and handcuffed him and brought him into the hospital. He denies needing any type of services. He reports that he is homeless and reports that he is not on any medications. He does endorse past marijuana usage but reports that he has not smoked in a little while and was surprised that UDS was positive for it. He reports that he has no family or support systems and reports that [place] not [place] the [place]. He refused to sign treatment agreement or visitation list stating that he will sign if he needs to. He was oriented to the unit and safety maintained.

## 2017-05-08 NOTE — BH Assessment (Signed)
Jordan Gomez  Per Jordan BeetsJacqueline Norman, Jordan Gomez, this pt requires psychiatric hospitalization.  Jordan Heinrichina Tate, Jordan Gomez, [facility]; [facility]00.  Pt presents under IVC initiated by law enforcement, and upheld by Jordan Gomez, and [facility].  Pt's nurse, Jordan Gomez, has been notified, and agrees to call report to [phone].  Pt is to be transported via [employer] when the time comes.   Jordan Gomez, [place] Behavioral Health Coordinator [phone]

## 2017-05-08 NOTE — ED Notes (Signed)
[facility] on unit to transfer [facility] to [facility] per MD order. Personal property given to [employer] for transfer. Pt ambulatory off unit in police custody.  

## 2017-05-08 NOTE — Tx Team (Signed)
Initial Treatment Plan 05/08/2017 4:09 PM Jordan Gomez [id number]    PATIENT STRESSORS: Substance abuse   PATIENT STRENGTHS: Ability for insight Average or above average intelligence Capable of independent living General fund of knowledge   PATIENT IDENTIFIED PROBLEMS: Psychosis "I just don't want to die from accidentally drinking pepsi"                     DISCHARGE CRITERIA:  Ability to meet basic life and health needs Improved stabilization in mood, thinking, and/or behavior  PRELIMINARY DISCHARGE PLAN: Attend aftercare/continuing care group  PATIENT/FAMILY INVOLVEMENT: This treatment plan has been presented to and reviewed with the patient, Jordan Gomez, and/or family member, .  The patient and family have been given the opportunity to ask questions and make suggestions.  Anish Vana, [place], [place]RN 05/08/2017, 4:09 PM

## 2017-05-08 NOTE — Consult Note (Addendum)
[place] [facility] Consult   Reason for Consult:  Psychosis  Referring Physician:  EDP Patient Identification: Jordan Gomez MRN:  [id number] Principal Diagnosis: Psychosis [facility]) Diagnosis:   Patient Active Problem List   Diagnosis Date Noted  . Psychosis ([place]) [F29] 05/08/2017    Priority: High    Total Time spent with patient: 30 minutes  Subjective:   Jordan Gomez is a 27 y.o. male patient admitted with psychosis.  HPI:  27 yo male who presented with psychosis, only drug use is cannabis and alcohol.  Patient's psychosis has improved but remains bizarre, still reporting visual hallucinations.  Not bathing but eating and cooperative.  Accepted to [facility] and will transfer this afternoon, further stabilization needed.  Past Psychiatric History: none noted  Risk to Self: Suicidal Ideation: No Suicidal Intent: No Is patient at risk for suicide?: No Suicidal Plan?: No Access to Means: No What has been your use of drugs/alcohol within the last 12 months?: alcohol use today How many times?: 0 Other Self Harm Risks: none Triggers for Past Attempts: None known Intentional Self Injurious Behavior: None Risk to Others: Homicidal Ideation: No Thoughts of Harm to Others: No Current Homicidal Intent: No Current Homicidal Plan: No Access to Homicidal Means: No Identified Victim: none History of harm to others?: No Assessment of Violence: None Noted Violent Behavior Description: none Does patient have access to weapons?: No Criminal Charges Pending?: No Does patient have a court date: No Prior Inpatient Therapy: Prior Inpatient Therapy: No Prior Outpatient Therapy: Prior Outpatient Therapy: No Does patient have an ACCT team?: No Does patient have Intensive In-House Services?  : No Does patient have [facility] services? : No Does patient have P4CC services?: No  Past Medical History: History reviewed. No pertinent past medical history. History reviewed. No pertinent  surgical history. Family History: No family history on file. Family Psychiatric  History: none Social History:  Social History   Substance and Sexual Activity  Alcohol Use Yes  . Frequency: Never   Comment: unable to answer     Social History   Substance and Sexual Activity  Drug Use Yes  . Types: Marijuana   Comment: every now and then, [facility]    Social History   Socioeconomic History  . Marital status: Single    Spouse name: Not on file  . Number of children: Not on file  . Years of education: Not on file  . Highest education level: Not on file  Occupational History  . Not on file  Social Needs  . Financial resource strain: Not on file  . Food insecurity:    Worry: Not on file    Inability: Not on file  . Transportation needs:    Medical: Not on file    Non-medical: Not on file  Tobacco Use  . Smoking status: Former Smoker    Types: Cigarettes  . Smokeless tobacco: Never Used  Substance and Sexual Activity  . Alcohol use: Yes    Frequency: Never    Comment: unable to answer  . Drug use: Yes    Types: Marijuana    Comment: every now and then, THC  . Sexual activity: Not Currently  Lifestyle  . Physical activity:    Days per week: Not on file    Minutes per session: Not on file  . Stress: Not on file  Relationships  . Social connections:    Talks on phone: Not on file    Gets together: Not on file    Attends  religious service: Not on file    Active member of club or organization: Not on file    Attends meetings of clubs or organizations: Not on file    Relationship status: Not on file  Other Topics Concern  . Not on file  Social History Narrative  . Not on file   Additional Social History: N/A    Allergies:  No Known Allergies  Labs:  Results for orders placed or performed during the hospital encounter of 05/06/17 (from the past 48 hour(s))  Rapid urine drug screen (hospital performed)     Status: Abnormal   Collection Time: 05/06/17  7:27 PM   Result Value Ref Range   Opiates NONE DETECTED NONE DETECTED   Cocaine NONE DETECTED NONE DETECTED   Benzodiazepines NONE DETECTED NONE DETECTED   Amphetamines NONE DETECTED NONE DETECTED   Tetrahydrocannabinol POSITIVE (A) NONE DETECTED   Barbiturates NONE DETECTED NONE DETECTED    Comment: (NOTE) DRUG SCREEN FOR MEDICAL PURPOSES ONLY.  IF CONFIRMATION IS NEEDED FOR ANY PURPOSE, NOTIFY LAB WITHIN 5 DAYS. LOWEST DETECTABLE LIMITS FOR URINE DRUG SCREEN Drug Class                     Cutoff (ng/mL) Amphetamine and metabolites    1000 Barbiturate and metabolites    200 Benzodiazepine                 003 Tricyclics and metabolites     300 Opiates and metabolites        300 Cocaine and metabolites        300 THC                            50 Performed at [facility], [place] [address], [place], [place] [address]   Ethanol     Status: Abnormal   Collection Time: 05/06/17  7:27 PM  Result Value Ref Range   Alcohol, Ethyl (B) 116 (H) <10 mg/dL    Comment:        LOWEST DETECTABLE LIMIT FOR SERUM ALCOHOL IS 10 mg/dL FOR MEDICAL PURPOSES ONLY Performed at [place] [address], [place], [place] [address]   Salicylate level     Status: None   Collection Time: 05/06/17  7:27 PM  Result Value Ref Range   Salicylate Lvl <9.7 2.8 - 30.0 mg/dL    Comment: Performed at [facility], [place] [address] [place], [place] [address]  Acetaminophen level     Status: Abnormal   Collection Time: 05/06/17  7:27 PM  Result Value Ref Range   Acetaminophen (Tylenol), Serum <10 (L) 10 - 30 ug/mL    Comment:        THERAPEUTIC CONCENTRATIONS VARY SIGNIFICANTLY. A RANGE OF 10-30 ug/mL MAY BE AN EFFECTIVE CONCENTRATION FOR MANY PATIENTS. HOWEVER, SOME ARE BEST TREATED AT CONCENTRATIONS OUTSIDE THIS RANGE. ACETAMINOPHEN CONCENTRATIONS >150 ug/mL AT 4 HOURS AFTER INGESTION AND >50 ug/mL AT 12 HOURS AFTER INGESTION ARE OFTEN ASSOCIATED  WITH TOXIC REACTIONS. Performed at [facility], [place] [address] [place],  [address]   CBC with Differential/Platelet     Status: None   Collection Time: 05/06/17  7:27 PM  Result Value Ref Range   WBC 8.4 4.0 - 10.5 K/uL   RBC 5.14 4.22 - 5.81 MIL/uL   Hemoglobin 15.4 13.0 - 17.0 g/dL   HCT 42.9 39.0 - 52.0 %  MCV 83.5 78.0 - 100.0 fL   MCH 30.0 26.0 - 34.0 pg   MCHC 35.9 30.0 - 36.0 g/dL   RDW 12.1 11.5 - 15.5 %   Platelets 248 150 - 400 K/uL   Neutrophils Relative % 76 %   Neutro Abs 6.4 1.7 - 7.7 K/uL   Lymphocytes Relative 16 %   Lymphs Abs 1.3 0.7 - 4.0 K/uL   Monocytes Relative 7 %   Monocytes Absolute 0.5 0.1 - 1.0 K/uL   Eosinophils Relative 1 %   Eosinophils Absolute 0.1 0.0 - 0.7 K/uL   Basophils Relative 0 %   Basophils Absolute 0.0 0.0 - 0.1 K/uL    Comment: Performed at [facility], [place] [address], [place], [place] [address]  Comprehensive metabolic panel     Status: Abnormal   Collection Time: 05/06/17  7:27 PM  Result Value Ref Range   Sodium 138 135 - 145 mmol/L   Potassium 3.5 3.5 - 5.1 mmol/L   Chloride 103 101 - 111 mmol/L   CO2 19 (L) 22 - 32 mmol/L   Glucose, Bld 84 65 - 99 mg/dL   BUN 20 6 - 20 mg/dL   Creatinine, Ser 1.02 0.61 - 1.24 mg/dL   Calcium 8.9 8.9 - 10.3 mg/dL   Total Protein 7.7 6.5 - 8.1 g/dL   Albumin 4.1 3.5 - 5.0 g/dL   AST 33 15 - 41 U/L   ALT 18 17 - 63 U/L   Alkaline Phosphatase 80 38 - 126 U/L   Total Bilirubin 0.7 0.3 - 1.2 mg/dL   GFR calc non Af Amer >60 >60 mL/min   GFR calc Af Amer >60 >60 mL/min    Comment: (NOTE) The eGFR has been calculated using the CKD EPI equation. This calculation has not been validated in all clinical situations. eGFR's persistently <60 mL/min signify possible Chronic Kidney Disease.    Anion gap 16 (H) 5 - 15    Comment: Performed at [facility], [place] [address], [place], [place] [address]    Current Facility-Administered  Medications  Medication Dose Route Frequency Provider Last Rate Last Dose  . LORazepam (ATIVAN) injection 0-4 mg  0-4 mg Intravenous Q6H Lacretia Leigh, MD   Stopped at 05/06/17 2205   Or  . LORazepam (ATIVAN) tablet 0-4 mg  0-4 mg Oral Q6H Lacretia Leigh, MD   1 mg at 05/07/17 2104  . [START ON 05/09/2017] LORazepam (ATIVAN) injection 0-4 mg  0-4 mg Intravenous Q12H Lacretia Leigh, MD       Or  . Derrill Memo ON 05/09/2017] LORazepam (ATIVAN) tablet 0-4 mg  0-4 mg Oral Q12H Lacretia Leigh, MD      . LORazepam (ATIVAN) tablet 1 mg  1 mg Oral Q6H PRN Lacretia Leigh, MD   1 mg at 05/06/17 2004  . risperiDONE (RISPERDAL) tablet 1 mg  1 mg Oral BID Faythe Dingwall, DO      . thiamine (VITAMIN B-1) tablet 100 mg  100 mg Oral Daily Lacretia Leigh, MD   100 mg at 05/08/17 1047   Or  . thiamine (B-1) injection 100 mg  100 mg Intravenous Daily Lacretia Leigh, MD       No current outpatient medications on file.    Musculoskeletal: Strength & Muscle Tone: within normal limits Gait & Station: normal Patient leans: N/A  Psychiatric Specialty Exam: Physical Exam  Nursing note and vitals reviewed. Constitutional: He is oriented to person, place, and time. He appears well-developed  and well-nourished.  HENT:  Head: Normocephalic.  Neck: Normal range of motion.  Respiratory: Effort normal.  Musculoskeletal: Normal range of motion.  Neurological: He is alert and oriented to person, place, and time.  Psychiatric: His speech is normal and behavior is normal. Judgment normal. His mood appears anxious. His affect is labile. Thought content is delusional. Cognition and memory are impaired.    Review of Systems  Psychiatric/Behavioral: The patient is nervous/anxious.   All other systems reviewed and are negative.   Blood pressure (!) 104/50, pulse 77, temperature 98.6 F (37 C), temperature source Oral, resp. rate 16, SpO2 100 %.There is no height or weight on file to calculate BMI.  General Appearance:  Disheveled  Eye Contact:  Fair  Speech:  Normal Rate  Volume:  Normal  Mood:  Euthymic  Affect:  Blunt  Thought Process:  Coherent and Descriptions of Associations: Intact  Orientation:  Full (Time, Place, and Person)  Thought Content:  Delusions  Suicidal Thoughts:  No  Homicidal Thoughts:  No  Memory:  Immediate;   Fair Recent;   Fair Remote;   Fair  Judgement:  Impaired  Insight:  Lacking  Psychomotor Activity:  Normal  Concentration:  Concentration: Fair and Attention Span: Fair  Recall:  [employer] of Knowledge:  Fair  Language:  Good  Akathisia:  No  Handed:  Right  AIMS (if indicated):   N/A  Assets:  Leisure Time Physical Health Resilience Social Support  ADL's:  Intact  Cognition:  Impaired,  Mild  Sleep:   Okay     Treatment Plan Summary: Daily contact with patient to assess and evaluate symptoms and progress in treatment, Medication management and Plan acute psychosis, unspecified type:  -Crisis stabilization -Medication management:  Continue Risperdal 1 mg BID for psychosis -Transfer to [facility] -Individual counseling  Disposition: Recommend psychiatric Inpatient admission when medically cleared.  Waylan Boga, NP 05/08/2017 11:15 AM   Patient seen face-to-face for psychiatric evaluation, chart reviewed and case discussed with the physician extender and developed treatment plan. Reviewed the information documented and agree with the treatment plan.  Buford Dresser, DO 05/08/17 7:05 PM

## 2017-05-08 NOTE — Progress Notes (Addendum)
  DATA ACTION RESPONSE  Objective- Pt. is visible in the dayroom, seen pacing.  Presents with an animated   affect and mood with mania. Speech and thought process was rapid/pressured/tangential/thought-blocking.  Pt states he does not want to take risperdal while here. Pt showered and attend wrap-up group.   Subjective- Denies having any SI/HI/AVH/Pain at this time. Is cooperative and remains safe on the unit.  1:1 interaction in private to establish rapport. Encouragement, education, & support given from staff.    Safety maintained with Q 15 checks. Continue with POC.

## 2017-05-09 DIAGNOSIS — Y905 Blood alcohol level of 100-119 mg/100 ml: Secondary | ICD-10-CM

## 2017-05-09 DIAGNOSIS — F23 Brief psychotic disorder: Principal | ICD-10-CM

## 2017-05-09 DIAGNOSIS — R450 Nervousness: Secondary | ICD-10-CM

## 2017-05-09 DIAGNOSIS — F419 Anxiety disorder, unspecified: Secondary | ICD-10-CM

## 2017-05-09 DIAGNOSIS — F29 Unspecified psychosis not due to a substance or known physiological condition: Secondary | ICD-10-CM

## 2017-05-09 MED ORDER — LORAZEPAM 1 MG PO TABS: 1.0000 mg | ORAL_TABLET | ORAL | Status: DC | PRN

## 2017-05-09 MED ORDER — PALIPERIDONE ER 6 MG PO TB24
6.0000 mg | ORAL_TABLET | Freq: Every day | ORAL | Status: DC
  Filled 2017-05-09 (×6): qty 1

## 2017-05-09 MED ORDER — HYDROXYZINE HCL 50 MG PO TABS
50.0000 mg | ORAL_TABLET | Freq: Four times a day (QID) | ORAL | Status: DC | PRN
  Filled 2017-05-09: qty 1

## 2017-05-09 MED ORDER — TRAZODONE HCL 50 MG PO TABS
50.0000 mg | ORAL_TABLET | Freq: Every evening | ORAL | Status: DC | PRN
  Filled 2017-05-09: qty 1

## 2017-05-09 MED ORDER — ZIPRASIDONE MESYLATE 20 MG IM SOLR: 20.0000 mg | INTRAMUSCULAR | Status: DC | PRN

## 2017-05-09 MED ORDER — OLANZAPINE 10 MG PO TBDP: 10.0000 mg | ORAL_TABLET | Freq: Three times a day (TID) | ORAL | Status: DC | PRN

## 2017-05-09 NOTE — [facility] Suicide Risk Assessment (Signed)
[facility]   Nursing information obtained from:    Demographic factors:    Current Mental Status:    Loss Factors:    Historical Factors:    Risk Reduction Factors:     Total Time spent with patient: 1 hour Principal Problem: Acute psychosis (HCC) Diagnosis:   Patient Active Problem List   Diagnosis Date Noted  . 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 Acute psychosis (HCC) [F23] 05/08/2017   Subjective Data: See H&P for details  Continued Clinical Symptoms:  Alcohol Use Disorder Identification Test Final Score (AUDIT): 2 The "Alcohol Use Disorders Identification Test", Guidelines for Use in Primary Care, Second Edition.  World Science writerHealth Organization [facility](WHO). Score between 0-7:  no or low risk or alcohol related problems. Score between 8-15:  moderate risk of alcohol related problems. Score between 16-19:  high risk of alcohol related problems. Score 20 or above:  warrants further diagnostic evaluation for alcohol dependence and treatment.   CLINICAL FACTORS:   Currently Psychotic Unstable or Poor Therapeutic Relationship  Psychiatric Specialty Exam: Physical Exam  Nursing note and vitals reviewed.   ROS- See H&P for details  Blood pressure (!) 106/53, pulse 69, temperature 98.3 F (36.8 C), temperature source Oral, resp. rate 16, height 5\' 4"  (1.626 m), weight 53.5 kg (118 lb).Body mass index is 20.25 kg/m.    COGNITIVE FEATURES THAT CONTRIBUTE TO RISK:  None    SUICIDE RISK:   Minimal: No identifiable suicidal ideation.  Patients presenting with no risk factors but with morbid ruminations; may be classified as minimal risk based on the severity of the depressive symptoms  PLAN OF CARE: See H&P for details  I certify that inpatient services furnished can reasonably be expected to improve the patient's condition.   Micheal Likenshristopher T Prabhjot Maddux, MD 05/09/2017, 4:38 PM

## 2017-05-09 NOTE — Tx Team (Signed)
Interdisciplinary Treatment and Diagnostic Plan Update  05/09/2017 Time of Session: 10:53 AM  Duke Weisensel MRN: [id number]  Principal Diagnosis:  Acute psychosis   Secondary Diagnoses: Active Problems:   Acute psychosis (HCC)   Current Medications:  Current Facility-Administered Medications  Medication Dose Route Frequency Provider Last Rate Last Dose  . acetaminophen (TYLENOL) tablet 650 mg  650 mg Oral Q6H PRN Charm Rings, NP      . alum & mag hydroxide-simeth (MAALOX/MYLANTA) 200-200-20 MG/5ML suspension 30 mL  30 mL Oral Q4H PRN Charm Rings, NP      . magnesium hydroxide (MILK OF MAGNESIA) suspension 30 mL  30 mL Oral Daily PRN Charm Rings, NP      . risperiDONE (RISPERDAL) tablet 1 mg  1 mg Oral BID Charm Rings, NP        PTA Medications: No medications prior to admission.    Treatment Modalities: Medication Management, Group therapy, Case management,  1 to 1 session with clinician, Psychoeducation, Recreational therapy.  Patient Stressors: Substance abuse  Patient Strengths: Ability for insight Average or above average intelligence Capable of independent living General fund of knowledge   Physician Treatment Plan for Primary Diagnosis: Acute psychosis Long Term Goal(s): Improvement in symptoms so as ready for discharge  Short Term Goals:    Medication Management: Evaluate patient's response, side effects, and tolerance of medication regimen.  Therapeutic Interventions: 1 to 1 sessions, Unit Group sessions and Medication administration.  Evaluation of Outcomes: Progressing  Physician Treatment Plan for Secondary Diagnosis: Active Problems:   Acute psychosis (HCC)  Long Term Goal(s): Improvement in symptoms so as ready for discharge  Short Term Goals:    Medication Management: Evaluate patient's response, side effects, and tolerance of medication regimen.  Therapeutic Interventions: 1 to 1 sessions, Unit Group sessions and Medication  administration.  Evaluation of Outcomes: Progressing   RN Treatment Plan for Primary Diagnosis: Acute psychosis Long Term Goal(s): Knowledge of disease and therapeutic regimen to maintain health will improve  Short Term Goals: Ability to demonstrate self-control, Ability to participate in decision making will improve, Ability to identify and develop effective coping behaviors will improve and Compliance with prescribed medications will improve  Medication Management: RN will administer medications as ordered by provider, will assess and evaluate patient's response and provide education to patient for prescribed medication. RN will report any adverse and/or side effects to prescribing provider.  Therapeutic Interventions: 1 on 1 counseling sessions, Psychoeducation, Medication administration, Evaluate responses to treatment, Monitor vital signs and CBGs as ordered, Perform/monitor CIWA, COWS, AIMS and Fall Risk screenings as ordered, Perform wound care treatments as ordered.  Evaluation of Outcomes: Progressing   LCSW Treatment Plan for Primary Diagnosis: Acute psychosis Long Term Goal(s): Safe transition to appropriate next level of care at discharge, Engage patient in therapeutic group addressing interpersonal concerns.  Short Term Goals: Engage patient in aftercare planning with referrals and resources, Facilitate acceptance of mental health diagnosis and concerns, Identify triggers associated with mental health/substance abuse issues and Increase skills for wellness and recovery  Therapeutic Interventions: Assess for all discharge needs, 1 to 1 time with Social worker, Explore available resources and support systems, Assess for adequacy in community support network, Educate family and significant other(s) on suicide prevention, Complete Psychosocial Assessment, Interpersonal group therapy.  Evaluation of Outcomes: Progressing   Progress in Treatment: Attending groups:  Yes Participating in groups: Yes Taking medication as prescribed: Yes Toleration of medication: Yes, no side effects reported at this time  Family/Significant other contact made: No Patient understands diagnosis: No, limited insight  Discussing patient identified problems/goals with staff: Yes Medical problems stabilized or resolved: Yes Denies suicidal/homicidal ideation: Yes Issues/concerns per patient self-inventory: None Other: N/A  New problem(s) identified: None identified at this time.   New Short Term/Long Term Goal(s): "To figure out why I am here".   Discharge Plan or Barriers:  It is not clear where the pt will live after discharge  Reason for Continuation of Hospitalization: Delusions Medication stabilization   Estimated Length of Stay: 05/14/17  Attendees: Patient: Jordan JumperRicardito Gomez 05/09/2017  10:53 AM  Physician: Renetta Chalkhristopher [place], MD 05/09/2017  10:53 AM  Nursing: Estella HuskElizabeth Awofabeju, RN 05/09/2017  10:53 AM  RN Care Manager: Onnie BoerJennifer Clark, RN 05/09/2017  10:53 AM  Social Worker: Richelle Itood North, LCSW; Melba CoonAngel Alyria Krack, Social Work Intern 05/09/2017  10:53 AM  Recreational Therapist: Caroll RancherMarjette Lindsay, LRT 05/09/2017  10:53 AM  Other: Tomasita Morrowelora Sutton, P4CC 05/09/2017  10:53 AM  Other:  05/09/2017  10:53 AM  Other: 05/09/2017  10:53 AM    Scribe for Treatment Team: Aram BeechamAngel M Ceasia Elwell, Student-Social Work 05/09/2017 10:53 AM

## 2017-05-09 NOTE — [facility] Counselor (Signed)
Adult Comprehensive Assessment  Patient ID: Jordan Gomez, male   DOB: Dec 16, 1990, 27 y.o.   MRN: [id number]  Information Source: Information source: Patient  Current Stressors:  Educational / Learning stressors: N/A Employment / Job issues: Pt is unemployed  Family Relationships: Pt states that he has a good relationship with his parents but does not talk to them often.  Financial / Lack of resources (include bankruptcy): Pt has no income  Housing / Lack of housing: Pt is living in a tent  Physical health (include injuries & life threatening diseases): N/A Social relationships: Pt has few social relationships  Substance abuse: Pt reports drinking alcohol 3-4 times a year but states that he "wants to drink more often" Bereavement / Loss: N/A  Living/Environment/Situation:  Living Arrangements: Alone Living conditions (as described by patient or guardian): "It's not bad" How long has patient lived in current situation?: 5 months  What is atmosphere in current home: Temporary, Dangerous  Family History:  Marital status: Single Are you sexually active?: No Does patient have children?: No  Childhood History:  By whom was/is the patient raised?: Both parents, Mother/father and step-parent Additional childhood history information: Pt states he was in "placement" from age 27 to [age].  He is unsure what type of placement this was.  Description of patient's relationship with caregiver when they were a child: Not good with mother and father but great with step-father "My step-father had a way of talking to me and making time for me" Patient's description of current relationship with people who raised him/her: "It's going well" Does patient have siblings?: Yes Number of Siblings: 3 Description of patient's current relationship with siblings: "They use to live with me but I don't talk to them much"  Did patient suffer any verbal/emotional/physical/sexual abuse as a child?: No Did patient  suffer from severe childhood neglect?: No Has patient ever been sexually abused/assaulted/raped as an adolescent or adult?: No Was the patient ever a victim of a crime or a disaster?: No Witnessed domestic violence?: No Has patient been effected by domestic violence as an adult?: No  Education:  Highest grade of school patient has completed: 12th  Currently a student?: No Learning disability?: No  Employment/Work Situation:   Employment situation: Unemployed Patient's job has been impacted by current illness: Yes Describe how patient's job has been impacted: Mental health concerns, confusion   What is the longest time patient has a held a job?: Pt is unsure  Where was the patient employed at that time?: Pt is unsure  Has patient ever been in the [employer] and Companymilitary?: No Has patient ever served in combat?: No Did You Receive Any Psychiatric Treatment/Services While in the [employer]ancorpMilitary?: No Are There Guns or Other Weapons in Your Home?: No Are These ComptrollerWeapons Safely Secured?: Yes  Financial Resources:   Financial resources: No income Does patient have a Lawyerrepresentative payee or guardian?: No  Alcohol/Substance Abuse:   What has been your use of drugs/alcohol within the last 12 months?: Pt reports drinking alcohol 3-4 times a year but states that he would like to drink more often  If attempted suicide, did drugs/alcohol play a role in this?: No Alcohol/Substance Abuse Treatment Hx: Denies past history Has alcohol/substance abuse ever caused legal problems?: No  Social Support System:   Forensic psychologistatient's Community Support System: None Describe Community Support System: "Am I supposed to know that,  people just show up" Type of faith/religion: "I don't like to biased to just one"  How does patient's faith help to  cope with current illness?: N/A  Leisure/Recreation:   Leisure and Hobbies: "Playing video and board games and anything else I find fun"  Strengths/Needs:   What things does the patient do  well?: "I'm still trying to figure it out" In what areas does patient struggle / problems for patient: "I don't know"  Discharge Plan:   Does patient have access to transportation?: Yes Will patient be returning to same living situation after discharge?: No Plan for living situation after discharge: Pt is unsure where he will live after discharge Currently receiving community mental health services: No If no, would patient like referral for services when discharged?: No Does patient have financial barriers related to discharge medications?: Yes Patient description of barriers related to discharge medications: Pt has no income or insurance   Summary/Recommendations:   Summary and Recommendations (to be completed by the evaluator): Jordan Gomez is a 27 year old African American/Caucasion male who has been diagnosed with Psychosis.  He presents with confusion.  He is a poor historian and is not able to give a timeline of events in his life.  He has recieved previous outpatient therapy but cannot recall when or with who.  Prior to living in a tent he was living with various friends and states that there was another time in the past where he lived in a tent.  It is unclear at this time where he will go upon discharge.  While in the hospital he can benefit from crisis stabilization, medication management, therapeutic milieu, and a referral for services.     Aram Beecham. 05/09/2017

## 2017-05-09 NOTE — [facility] Group Notes (Addendum)
[facility] LCSW Group Therapy 05/09/2017 1:15pm  Type of Therapy: Group Therapy- Feelings Around Discharge & Establishing a Supportive Framework  Participation Level:  Minimal  Description of Group:   What is a supportive framework? What does it look like feel like and how do I discern it from and unhealthy non-supportive network? Learn how to cope when supports are not helpful and don't support you. Discuss what to do when your family/friends are not supportive.  Summary of Patient Progress  Jordan Gomez came to group but he was late to the session.  He states that for him the word Jordan Gomez reminds him of a girl he knows by the name of Jordan Gomez.  He attempts to see the good in people and tries to be open and honest with the people around him.  For him Jordan Gomez is having people around that he can bond with and trust.  [facility]      Therapeutic Modalities:   Cognitive Behavioral Therapy Person-Centered Therapy Motivational Interviewing   Jordan Gomez, Student-Social Work 05/09/2017 1:27 PM

## 2017-05-09 NOTE — H&P (Signed)
Psychiatric Admission Assessment Adult  Patient Identification: Jordan Gomez MRN:  [id number] Date of Evaluation:  05/09/2017 Chief Complaint:  brief psychotic disorder  alcohol intoxication with mild use disorder Principal Diagnosis: Acute psychosis (HCC) Diagnosis:   Patient Active Problem List   Diagnosis Date Noted  . 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 Acute psychosis (HCC) [F23] 05/08/2017   History of Present Illness:   Jordan Gomez is a 27 y/o M with unknown psychiatric history who was admitted on IVC from [facility] after he was brought in by [facility] with symptoms of psychosis including bizarre behaviors of standing in traffic, disorganized speech, poor hygiene, and poor insight. Pt also had BAL of 116 at time of admission. He was started on Risperdal 1mg  po BID in the ED (which he has been refusing) and then transferred to [facility] for additional treatment and evaluation.   Upon initial interview, pt shares, "I drank a little bit that morning and I was in [employer], and then I was leaving with some other people and I saw this guy trying to cross the street, and I saw the light was going to change, so I decided to stand in the cross-walk to be sure he could make it, but I guess he didn't want my help. So I kept standing there and the cops showed up, and then I decided to run." Pt is generally disorganized and a poor historian. He makes several vague, circumstantial,  and tangential statements. He indicates that he has been living in a tent with another individual and he has been experiencing worsening symptoms of psychosis including AH and VH. Pt shares that he sees people and he is unsure if they are real. He states, "I keep trying to tell what's real and what's not." He denies symptoms of depression. He denies SI/HI. He denies symptoms of mania, OCD, and PTSD. He reports using alcohol about 1-2 times per week and on those occassions he has 10-15 beers and 4-6 shots of hard  alcohol. He was using cannabis about 3 times per week until about 1 week ago. He denies other illicit substance use.  Discussed with patient about treatment options. He has been refusing risperdal, and discussed with patient about an alternative medication to help with is symptoms and thought organization. Pt is in agreement to trial of Invega with tentative plan to transition to long-acting injectable form if he has good tolerability and efficacy. Pt had no further questions, comments, or concerns.  Associated Signs/Symptoms: Depression Symptoms:  anxiety, (Hypo) Manic Symptoms:  Distractibility, Hallucinations, Anxiety Symptoms:  NA Psychotic Symptoms:  Hallucinations: Auditory Visual PTSD Symptoms: NA Total Time spent with patient: 1 hour  Past Psychiatric History:  - no previous dx - no previous inpatient stays - no current outpatient provider - no suicide attempt history  Is the patient at risk to self? Yes.    Has the patient been a risk to self in the past 6 months? Yes.    Has the patient been a risk to self within the distant past? Yes.    Is the patient a risk to others? No.  Has the patient been a risk to others in the past 6 months? No.  Has the patient been a risk to others within the distant past? No.   Prior Inpatient Therapy:   Prior Outpatient Therapy:    Alcohol Screening: 1. How often do you have a drink containing alcohol?: Monthly or less 2. How many drinks containing alcohol do you have  on a typical day when you are drinking?: 1 or 2 3. How often do you have six or more drinks on one occasion?: Less than monthly AUDIT-C Score: 2 4. How often during the last year have you found that you were not able to stop drinking once you had started?: Never 5. How often during the last year have you failed to do what was normally expected from you becasue of drinking?: Never 6. How often during the last year have you needed a first drink in the morning to get yourself  going after a heavy drinking session?: Never 7. How often during the last year have you had a feeling of guilt of remorse after drinking?: Never 8. How often during the last year have you been unable to remember what happened the night before because you had been drinking?: Never 9. Have you or someone else been injured as a result of your drinking?: No 10. Has a relative or friend or a doctor or another health worker been concerned about your drinking or suggested you cut down?: No Alcohol Use Disorder Identification Test Final Score (AUDIT): 2 Intervention/Follow-up: AUDIT Score <7 follow-up not indicated Substance Abuse History in the last 12 months:  Yes.   Consequences of Substance Abuse: Medical Consequences:  worsened psychosis Previous Psychotropic Medications: No  Psychological Evaluations: No  Past Medical History: History reviewed. No pertinent past medical history. History reviewed. No pertinent surgical history. Family History: History reviewed. No pertinent family history. Family Psychiatric  History: mother had suicide attempt Tobacco Screening: Have you used any form of tobacco in the last 30 days? (Cigarettes, Smokeless Tobacco, Cigars, and/or Pipes): No Social History: Pt was raised in [place] and he has lived in [place] for about 4 months. He is homeless and stays in a tent with a friend. He does not work. He does not have children. He does not have legal or trauma history. Social History   Substance and Sexual Activity  Alcohol Use Yes  . Frequency: Never   Comment: unable to answer     Social History   Substance and Sexual Activity  Drug Use Yes  . Types: Marijuana   Comment: every now and then, THC    Additional Social History: Marital status: Single Are you sexually active?: No Does patient have children?: No                         Allergies:  No Known Allergies Lab Results: No results found for this or any previous visit (from the past 48  hour(s)).  Blood Alcohol level:  Lab Results  Component Value Date   ETH 116 (H) 05/06/2017   ETH <10 02/01/2017    Metabolic Disorder Labs:  No results found for: HGBA1C, MPG No results found for: PROLACTIN No results found for: CHOL, TRIG, HDL, CHOLHDL, VLDL, LDLCALC  Current Medications: Current Facility-Administered Medications  Medication Dose Route Frequency Provider Last Rate Last Dose  . acetaminophen (TYLENOL) tablet 650 mg  650 mg Oral Q6H PRN Charm Rings, NP      . alum & mag hydroxide-simeth (MAALOX/MYLANTA) 200-200-20 MG/5ML suspension 30 mL  30 mL Oral Q4H PRN Charm Rings, NP      . hydrOXYzine (ATARAX/VISTARIL) tablet 50 mg  50 mg Oral Q6H PRN Micheal Likens, MD      . OLANZapine zydis (ZYPREXA) disintegrating tablet 10 mg  10 mg Oral Q8H PRN Micheal Likens, MD  And  . LORazepam (ATIVAN) tablet 1 mg  1 mg Oral PRN Micheal Likens, MD       And  . ziprasidone (GEODON) injection 20 mg  20 mg Intramuscular PRN Micheal Likens, MD      . magnesium hydroxide (MILK OF MAGNESIA) suspension 30 mL  30 mL Oral Daily PRN Charm Rings, NP      . paliperidone (INVEGA) 24 hr tablet 6 mg  6 mg Oral Daily Micheal Likens, MD      . traZODone (DESYREL) tablet 50 mg  50 mg Oral QHS PRN Micheal Likens, MD       PTA Medications: No medications prior to admission.    Musculoskeletal: Strength & Muscle Tone: within normal limits Gait & Station: normal Patient leans: N/A  Psychiatric Specialty Exam: Physical Exam  Nursing note and vitals reviewed.   Review of Systems  Constitutional: Negative for chills and fever.  Respiratory: Negative for cough and shortness of breath.   Cardiovascular: Negative for chest pain.  Gastrointestinal: Negative for abdominal pain, heartburn, nausea and vomiting.  Psychiatric/Behavioral: Positive for hallucinations and substance abuse. Negative for depression and suicidal  ideas. The patient is nervous/anxious. The patient does not have insomnia.     Blood pressure (!) 106/53, pulse 69, temperature 98.3 F (36.8 C), temperature source Oral, resp. rate 16, height 5\' 4"  (1.626 m), weight 53.5 kg (118 lb).Body mass index is 20.25 kg/m.  General Appearance: Casual, Disheveled and malodorous  Eye Contact:  Good  Speech:  Clear and Coherent and Normal Rate  Volume:  Normal  Mood:  Euthymic  Affect:  Blunt and Congruent  Thought Process:  Disorganized and Descriptions of Associations: Tangential  Orientation:  Full (Time, Place, and Person)  Thought Content:  Hallucinations: Auditory Visual, Tangential and Abstract Reasoning  Suicidal Thoughts:  No  Homicidal Thoughts:  No  Memory:  Immediate;   Fair Recent;   Fair Remote;   Fair  Judgement:  Poor  Insight:  Lacking  Psychomotor Activity:  Normal  Concentration:  Concentration: Fair  Recall:  [employer] of Knowledge:  Fair  Language:  Fair  Akathisia:  No  Handed:    AIMS (if indicated):     Assets:  Communication Skills Resilience  ADL's:  Intact  Cognition:  WNL  Sleep:  Number of Hours: 6.25   Treatment Plan Summary: Daily contact with patient to assess and evaluate symptoms and progress in treatment and Medication management  Observation Level/Precautions:  15 minute checks  Laboratory:  CBC Chemistry Profile HbAIC UDS  Psychotherapy:  Encourage participation in groups and therapeutic milieu   Medications:  DC risperdal. Start Invega 6mg  po Qday. Start atarax 50mg  po q6h prn anxiety. Start trazodone 50mg  po qhs prn insomnia. Start agitation protocol with zydis/ativan/geodon.  Consultations:    Discharge Concerns:    Estimated LOS: 5-7 days  Other:     Physician Treatment Plan for Primary Diagnosis: Acute psychosis (HCC) Long Term Goal(s): Improvement in symptoms so as ready for discharge  Short Term Goals: Ability to identify and develop effective coping behaviors will  improve  Physician Treatment Plan for Secondary Diagnosis: Principal Problem:   Acute psychosis (HCC)  Long Term Goal(s): Improvement in symptoms so as ready for discharge  Short Term Goals: Ability to identify triggers associated with substance abuse/mental health issues will improve  I certify that inpatient services furnished can reasonably be expected to improve the patient's condition.    Burlene Arnt  [place], MD 4/5/20194:21 PM

## 2017-05-09 NOTE — Progress Notes (Signed)
Recreation Therapy Notes  Date: 4.5.19 Time: 10:00 a.m.  Location: [address] Dayroom   Group Topic: Self-Expression   Goal Area(s) Addresses:  -Patient will participate in group discussions  -Patient will express their thoughts/feelings through art  -Patient will report feeling calm after group tx.   Behavioral Response: Appropriate, Engaged   Intervention: Art   Activity: Patients had the opportunity to express their thoughts or feelings through the use of art. Patients painted a canvas using the paint provided.   Education: Self-Expression, Hydrologist Education  Clinical Observations/Feedback: Patient was engaged during Recreation Therapy group tx. Patient participated during opening and closing discussion. Patient reported being able to express their thoughts/ feelings through their painting. Patient reported feeling calm and distracted from thoughts. Patient successfully met Goal 1.1 (see above).    Ranell Patrick, Recreation Therapy Intern   Ranell Patrick 05/09/2017 11:39 AM

## 2017-05-09 NOTE — [facility] Suicide Risk Assessment (Signed)
[facility] INPATIENT:  Family/Significant Other Suicide Prevention Education  Suicide Prevention Education:  Patient Refusal for Family/Significant Other Suicide Prevention Education: The patient Jordan Gomez has refused to provide written consent for family/significant other to be provided Family/Significant Other Suicide Prevention Education during admission and/or prior to discharge.  Physician notified.  Metro Kungngel M Pinchos Topel 05/09/2017, 10:32 AM

## 2017-05-09 NOTE — Progress Notes (Signed)
Recreation Therapy Notes  INPATIENT RECREATION THERAPY ASSESSMENT  Patient Details Name: Jordan Gomez MRN: [id number] DOB: 09/06/1990 Today's Date: 05/09/2017       Information Obtained From: Patient  Able to Participate in Assessment/Interview: Yes  Patient Presentation: Responsive  Reason for Admission (Per Patient): Patient reports being admitted into the hospital due to being brought here by the police. Patient states that he tried to stop traffic to help someone cross the street.   Patient Stressors: Patient reported no stressors   Coping Skills:   Isolation, Sports, Avoidance, Deep Breathing, Arguments, Aggression, Impulsivity, Write, Read, Talk, Art, Substance Abuse, Music, Meditate, Exercise, Dance  Leisure Interests (2+):  Individual - Reading, Games - Video games, Games - PublixBoard games, Exercise - Walking, Sports - Basketball  Frequency of Recreation/Participation: Marketing executiveWeekly  Awareness of Community Resources:  No  Current Use: No  If no, Barriers?: Leisure Skills  Expressed Interest in State Street CorporationCommunity Resource Information: Yes  [place] EnergyCounty of Residence:  [place]   Patient Main Form of Transportation: Walk  Patient Strengths:  Patient was not able to identify any strenghts   Patient Identified Areas of Improvement:  "Patience"   Patient Goal for Hospitalization:  "To better guide my emotions"   Current SI (including self-harm):  No  Current HI:  No  Current AVH: No  Staff Intervention Plan: Group Attendance, Collaborate with Interdisciplinary Treatment Team  Consent to Intern Participation: Yes   Sheryle Hailarian Taylin Mans, Recreation Therapy Intern   Sheryle HailDarian Carroll Lingelbach 05/09/2017, 2:28 PM

## 2017-05-10 DIAGNOSIS — Z87891 Personal history of nicotine dependence: Secondary | ICD-10-CM

## 2017-05-10 DIAGNOSIS — F1290 Cannabis use, unspecified, uncomplicated: Secondary | ICD-10-CM

## 2017-05-10 DIAGNOSIS — G4700 Insomnia, unspecified: Secondary | ICD-10-CM

## 2017-05-10 NOTE — Progress Notes (Addendum)
[facility]  05/10/2017 8:00 AM Jordan Gomez  MRN:  [id number]   Subjective:  Jordan Gomez seen sitting in dayroom interacting with peers. Patient is alert and oriented to self and place. Patient continues to report that he is unsure why he has been hospitalized. Patient reports  'bad behavior."  Patient is prescribed invega 6mg  however reports he is going to continue to refuse invega  because " I was provided a choice to take the medication or not."  If this is the same a Risperdal then I don't need this medication at all. Denies hallucinations. Patient continue to presents loose and tangential. Patient appears to be paranoid with thoughts. Reports theses people are against me.  Patient is dishevel and malodorous. Support, encouragement and reassurance was provided.    History: per assessment note- Jordan Gomez "Jordan Gomez" Jordan Gomez is a 27 y/o M with unknown psychiatric history who [facility] of psychosis including bizarre behaviors of standing in traffic, disorganized speech, poor hygiene, and poor insight. Pt also had BAL of 116 at time of admission. He was started on Risperdal 1mg  po BID in the ED (which he has been refusing) and [facility]     Principal Problem: Acute psychosis (HCC)  Diagnosis:   Patient Active Problem List   Diagnosis Date Noted  . 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 No pertinent past medical history. History reviewed. No pertinent surgical history. Family History: History reviewed. No pertinent family history. Family Psychiatric  History:  Social History:  Social History   Substance and Sexual Activity  Alcohol Use Yes  . Frequency: Never   Comment: unable to answer     Social History    Substance and Sexual Activity  Drug Use Yes  . Types: Marijuana   [facility]    Social History   Socioeconomic History  . Marital status: Single    Spouse name: Not on file  . Number of children: Not on file  . Years of education: Not on file  . Highest education level: Not on file  Occupational History  . Not on file  Social Needs  . Financial resource strain: Not on file  . Food insecurity:    Worry: Not on file    Inability: Not on file  . Transportation needs:    Medical: Not on file    Non-medical: Not on file  Tobacco Use  . Smoking status: Former Smoker    Types: Cigarettes  . Smokeless tobacco: Never Used  Substance and Sexual Activity  . Alcohol use: Yes    Frequency: Never    Comment: unable to answer  . Drug use: Yes    Types: Marijuana    Comment: every now and then, THC  . Sexual activity: Not Currently  Lifestyle  . Physical activity:    Days per week: Not on file    Minutes per session: Not on file  . Stress: Not on file  Relationships  . Social connections:    Talks on phone: Not on file    Gets together: Not on file    Attends religious service: Not on file    Active member of club or organization: Not on file    Attends meetings  of clubs or organizations: Not on file    Relationship status: Not on file  Other Topics Concern  . Not on file  Social History Narrative  . Not on file   Additional Social History:                         Sleep: Fair  Appetite:  Fair  Current Medications: Current Facility-Administered Medications  Medication Dose Route Frequency Provider Last Rate Last Dose  . acetaminophen (TYLENOL) tablet 650 mg  650 mg Oral Q6H PRN Charm Rings, NP      . alum & mag hydroxide-simeth (MAALOX/MYLANTA) 200-200-20 MG/5ML suspension 30 mL  30 mL Oral Q4H PRN Charm Rings, NP      . hydrOXYzine (ATARAX/VISTARIL) tablet 50 mg  50 mg Oral Q6H PRN Micheal Likens, MD      .  OLANZapine zydis (ZYPREXA) disintegrating tablet 10 mg  10 mg Oral Q8H PRN Micheal Likens, MD       And  . LORazepam (ATIVAN) tablet 1 mg  1 mg Oral PRN Micheal Likens, MD       And  . ziprasidone (GEODON) injection 20 mg  20 mg Intramuscular PRN Micheal Likens, MD      . magnesium hydroxide (MILK OF MAGNESIA) suspension 30 mL  30 mL Oral Daily PRN Charm Rings, NP      . paliperidone (INVEGA) 24 hr tablet 6 mg  6 mg Oral Daily Micheal Likens, MD      . traZODone (DESYREL) tablet 50 mg  50 mg Oral QHS PRN Micheal Likens, MD        Lab Results: No results found for this or any previous visit (from the past 48 hour(s)).  Blood Alcohol level:  Lab Results  Component Value Date   ETH 116 (H) 05/06/2017   ETH <10 02/01/2017    Metabolic Disorder Labs: No results found for: HGBA1C, MPG No results found for: PROLACTIN No results found for: CHOL, TRIG, HDL, CHOLHDL, VLDL, LDLCALC  Physical Findings: AIMS: Facial and Oral Movements Muscles of Facial Expression: None, normal Lips and Perioral Area: None, normal Jaw: None, normal Tongue: None, normal,Extremity Movements Upper (arms, wrists, hands, fingers): None, normal Lower (legs, knees, ankles, toes): None, normal, Trunk Movements Neck, shoulders, hips: None, normal, Overall Severity Severity of abnormal movements (highest score from questions above): None, normal Incapacitation due to abnormal movements: None, normal Patient's awareness of abnormal movements (rate only patient's report): No Awareness, Dental Status Current problems with teeth and/or dentures?: No Does patient usually wear dentures?: No  CIWA:    COWS:     Musculoskeletal: Strength & Muscle Tone: within normal limits Gait & Station: normal Patient leans: N/A  Psychiatric Specialty Exam: Physical Exam  Vitals reviewed. Constitutional: He appears well-developed.  Cardiovascular: Normal rate.  Neurological:  He is alert.  Psychiatric: He has a normal mood and affect.    Review of Systems  Psychiatric/Behavioral: Positive for hallucinations. Negative for depression. The patient is nervous/anxious. The patient does not have insomnia.   All other systems reviewed and are negative.   Blood pressure 121/70, pulse 66, temperature 97.9 F (36.6 C), temperature source Oral, resp. rate 16, height 5\' 4"  (1.626 m), weight 53.5 kg (118 lb).Body mass index is 20.25 kg/m.  General Appearance: Disheveled  Eye Contact:  Fair  Speech:  Clear and Coherent  Volume:  Normal  Mood:  Dysphoric  Affect:  Congruent  Thought Process:  Disorganized and Linear  Orientation:  Other:  self and place  Thought Content:  Delusions and Paranoid Ideation  Suicidal Thoughts:  No  Homicidal Thoughts:  No  Memory:  Immediate;   Poor Recent;   Poor  Judgement:  Poor  Insight:  Lacking  Psychomotor Activity:  Normal  Concentration:  Concentration: Fair  Recall:  [employer] of Knowledge:  Fair  Language:  Fair  Akathisia:  No  Handed:  Right  AIMS (if indicated):     Assets:  Communication Skills Desire for Improvement Physical Health Resilience  ADL's:  Impaired  Cognition:  WNL  Sleep:  Number of Hours: 1.25     Treatment Plan Summary: Daily contact with patient to assess and evaluate symptoms and progress in treatment and Medication management   Patient to continue with current treatment plan on 05/10/2017 except where noted  Agitation Protocol made available see chart  Continue Invega 6 mg  for mood stabilization Continue with Trazodone 50 mg for insomnia  Will continue to monitor vitals ,medication compliance and treatment side effects while patient is here.   CSW will start working on disposition.  Patient to participate in therapeutic milieu   Oneta Rack, NP 05/10/2017, 8:00 AM   Agree with NP Progress Note

## 2017-05-10 NOTE — Progress Notes (Signed)
D: Pt denies SI/HI/AV hallucinations. Patient in room most of shift. Patient pleasant and cooperative. A: Pt was offered support and encouragement. Pt was given scheduled medications. Pt was encourage to attend groups. Q 15 minute checks were done for safety.  R: Pt is taking medication. Pt has no complaints.Pt receptive to treatment and safety maintained on unit.

## 2017-05-10 NOTE — Plan of Care (Signed)
  Problem: Activity: Goal: Sleeping patterns will improve Outcome: Progressing   

## 2017-05-10 NOTE — Plan of Care (Signed)
Problem: Safety: Goal: Periods of time without injury will increase Intervention: Patient contracts for safety on the unit. Low fall risk precautions in place. Safety monitored with q15 minute checks. Outcome: Patient remains safe on the unit at this time. 05/10/2017 11:41 AM - Progressing by Annia Friendly, RN   Problem: Coping: Goal: Ability to verbalize frustrations and anger appropriately will improve Intervention: Patient has met with treatment team and is provided 1:1 active listening. Patient encouraged to ask treatment team questions. Outcome: Patient agrees to make needs known to staff. 05/10/2017 11:41 AM - Progressing by Annia Friendly, RN

## 2017-05-10 NOTE — Progress Notes (Signed)
Adult Psychoeducational Group Note  Date:  05/10/2017 Time:  3:13 PM  Group Topic/Focus:  Managing Feelings:   The focus of this group is to identify what feelings patients have difficulty handling and develop a plan to handle them in a healthier way upon discharge.  Participation Level:  Minimal  Participation Quality:  Redirectable  Affect:  Excited  Cognitive:  Disorganized and Confused  Insight: Lacking  Engagement in Group:  Monopolizing and Off Topic  Modes of Intervention:  Discussion and Education  Additional Comments:  Pt was able to attend group but was off topic and needed to be redirected numerous times, but to no avail.   Gudrun Axe E 05/10/2017, 3:13 PM

## 2017-05-10 NOTE — Progress Notes (Signed)
Adult Psychoeducational Group Note  Date:  05/10/2017 Time:  4:00 PM  Group Topic/Focus: SMART Goals Goals Group: The focus of this group is to teach patients how to make goals that are specific, measurable, attainable, relative, and timely. Each patient is instructed to practice setting a goal using the SMART tool in order to achieve success during treatment and discuss how the patient can incorporate goal setting into their daily lives to aide in recovery.  Participation Level:  Did not attend  Participation Quality:  Not Applicable  Affect:  Not Applicable  Cognitive:  Not Applicable  Insight: Not Applicable  Engagement in Group:  Did not attend  Modes of Intervention:  Discussion, Education, Socialization and Support  Additional Comments: Patient was invited but did not attend the group.  Hoover Grewe A Jase Reep 05/10/2017, 5:45 

## 2017-05-10 NOTE — Progress Notes (Signed)
Patient ID: Jordan JumperRicardito Gomez, male   DOB: [birth year], 27 y.o.   MRN: [id number] DAR Note: Pt with no insight into the reasons for his admission denies any anxiety, depression, pain or AVH; "I don't really understand why I'm here because there is nothing wrong with me." Pt continue to refuse medications offered. All Pt's questions and concerns addressed. Will continue to monitor for safety.

## 2017-05-10 NOTE — Progress Notes (Signed)
Nursing Progress Note [phone]  Data: Patient presents with a defensive/sarcastic interaction. Patient refused his morning medications and states, "I don't take anything, you can't make me". Patient observed interacting with peers in the dayroom. Patient denies SI/HI and contracts for safety on the unit. When asked about AVH, patient states, "sometimes" but does not elaborate further when prompted. Patient contracts for safety on the unit at this time. Patient completed self-inventory sheet and rate depression, hopelessness, and anxiety 0,0,0 respectively. Patient reports he has "no goals" at this time and does not think he needs to be here. Patient with limited insight at this time.  Action: Patient educated about medication per provider's orders. Patient safety maintained with q15 min safety checks. Low fall risk precautions in place. Emotional support given. 1:1 interaction and active listening provided. Patient encouraged to attend meals and groups. Labs, vital signs and patient behavior monitored throughout shift. Patient encouraged to work on treatment plan and goals.  Response: Patient remains safe on the unit at this time. Patient is interacting with peers appropriately on the unit. Will continue to support and monitor.

## 2017-05-11 NOTE — Progress Notes (Signed)
D: Pt denies SI/HI/AVH. Pt is pleasant and cooperative. Pt continues to believe he does not need to be here. Pt visible on the unit in the dayroom most of the evening.   A: Pt was offered support and encouragement. Pt was given scheduled medications. Pt was encourage to attend groups. Q 15 minute checks were done for safety.   R:Pt attends groups and interacts well with peers and staff. Pt is taking medication. Pt has no complaints.Pt receptive to treatment and safety maintained on unit.

## 2017-05-11 NOTE — Progress Notes (Addendum)
[facility] MD Progress Note  05/11/2017 11:05 AM [name]  MRN:  [id number]   Subjective:  Jordan Gomez seen sitting in dayroom interacting with peers putting a puzzle together.  Patient continues to be disorganized and tangential.  Patient reports doing well without medications. " Why do I need to take medications if I am okay in my mind."    Patient continues to jump topic during the assessment. Patient is alert and oriented to self and place.  Patient is paranoid and guarded. Patient  is a pleasant, calm and cooperative while unit.  Patient is not agreeable to taking any medications.  Reports a good appetite and states he is resting well.  Continues to present disheveled and malodorous.  Hygiene was encourage.  Patient seen attending group sessions with active participation. Continues to deny self harm, suicidal or homicidal ideations. Support, encouragement and reassurance was provided.    History: per assessment note- Jordan Gomez is a 27 y/o M with unknown psychiatric history who was admitted on IVC from [facility] after he was brought in by [facility] with symptoms of psychosis including bizarre behaviors of standing in traffic, disorganized speech, poor hygiene, and poor insight. Pt also had BAL of 116 at time of admission. He was started on Risperdal 1mg  po BID in the ED (which he has been refusing) and then transferred to [facility] for additional treatment and evaluation.     Principal Problem: Acute psychosis (HCC)  Diagnosis:   Patient Active Problem List   Diagnosis Date Noted  . 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 No pertinent past medical history. History reviewed. No pertinent surgical history. Family History: History reviewed. No pertinent family history. Family Psychiatric  History:  Social History:  Social History    Substance and Sexual Activity  Alcohol Use Yes  . Frequency: Never   Comment: unable to answer     Social History   Substance and Sexual Activity  Drug Use Yes  . Types: Marijuana   Comment: every now and then, [facility]    Social History   Socioeconomic History  . Marital status: Single    Spouse name: Not on file  . Number of children: Not on file  . Years of education: Not on file  . Highest education level: Not on file  Occupational History  . Not on file  Social Needs  . Financial resource strain: Not on file  . Food insecurity:    Worry: Not on file    Inability: Not on file  . Transportation needs:    Medical: Not on file    Non-medical: Not on file  Tobacco Use  . Smoking status: Former Smoker    Types: Cigarettes  . Smokeless tobacco: Never Used  Substance and Sexual Activity  . Alcohol use: Yes    Frequency: Never    Comment: unable to answer  . Drug use: Yes    Types: Marijuana    Comment: every now and then, THC  . Sexual activity: Not Currently  Lifestyle  . Physical activity:    Days per week: Not on file    Minutes per session: Not on file  . Stress: Not on file  Relationships  . Social connections:    Talks on phone: Not on file    Gets together: Not on file    Attends religious service: Not on  file    Active member of club or organization: Not on file    Attends meetings of clubs or organizations: Not on file    Relationship status: Not on file  Other Topics Concern  . Not on file  Social History Narrative  . Not on file   Additional Social History:                         Sleep: Fair  Appetite:  Fair  Current Medications: Current Facility-Administered Medications  Medication Dose Route Frequency Provider Last Rate Last Dose  . acetaminophen (TYLENOL) tablet 650 mg  650 mg Oral Q6H PRN Charm Rings, NP      . alum & mag hydroxide-simeth (MAALOX/MYLANTA) 200-200-20 MG/5ML suspension 30 mL  30 mL Oral Q4H PRN Charm Rings, NP      . hydrOXYzine (ATARAX/VISTARIL) tablet 50 mg  50 mg Oral Q6H PRN Micheal Likens, MD      . OLANZapine zydis (ZYPREXA) disintegrating tablet 10 mg  10 mg Oral Q8H PRN Micheal Likens, MD       And  . LORazepam (ATIVAN) tablet 1 mg  1 mg Oral PRN Micheal Likens, MD       And  . ziprasidone (GEODON) injection 20 mg  20 mg Intramuscular PRN Micheal Likens, MD      . magnesium hydroxide (MILK OF MAGNESIA) suspension 30 mL  30 mL Oral Daily PRN Charm Rings, NP      . paliperidone (INVEGA) 24 hr tablet 6 mg  6 mg Oral Daily Micheal Likens, MD      . traZODone (DESYREL) tablet 50 mg  50 mg Oral QHS PRN Micheal Likens, MD        Lab Results: No results found for this or any previous visit (from the past 48 hour(s)).  Blood Alcohol level:  Lab Results  Component Value Date   ETH 116 (H) 05/06/2017   ETH <10 02/01/2017    Metabolic Disorder Labs: No results found for: HGBA1C, MPG No results found for: PROLACTIN No results found for: CHOL, TRIG, HDL, CHOLHDL, VLDL, LDLCALC  Physical Findings: AIMS: Facial and Oral Movements Muscles of Facial Expression: None, normal Lips and Perioral Area: None, normal Jaw: None, normal Tongue: None, normal,Extremity Movements Upper (arms, wrists, hands, fingers): None, normal Lower (legs, knees, ankles, toes): None, normal, Trunk Movements Neck, shoulders, hips: None, normal, Overall Severity Severity of abnormal movements (highest score from questions above): None, normal Incapacitation due to abnormal movements: None, normal Patient's awareness of abnormal movements (rate only patient's report): No Awareness, Dental Status Current problems with teeth and/or dentures?: No Does patient usually wear dentures?: No  CIWA:    COWS:     Musculoskeletal: Strength & Muscle Tone: within normal limits Gait & Station: normal Patient leans: N/A  Psychiatric Specialty  Exam: Physical Exam  Vitals reviewed. Constitutional: He appears well-developed.  Cardiovascular: Normal rate.  Neurological: He is alert.  Psychiatric: He has a normal mood and affect.    Review of Systems  Psychiatric/Behavioral: Positive for hallucinations. Negative for depression. The patient is nervous/anxious. The patient does not have insomnia.   All other systems reviewed and are negative.   Blood pressure 105/61, pulse 75, temperature (!) 97.5 F (36.4 C), temperature source Oral, resp. rate 18, height 5\' 4"  (1.626 m), weight 53.5 kg (118 lb).Body mass index is 20.25 kg/m.  General Appearance: Disheveled  Eye Contact:  Fair  Speech:  Clear and Coherent  Volume:  Normal  Mood:  Dysphoric  Affect:  Congruent  Thought Process:  Disorganized and Linear  Orientation:  Other:  self and place  Thought Content:  Delusions and Paranoid Ideation  Suicidal Thoughts:  No  Homicidal Thoughts:  No  Memory:  Immediate;   Poor Recent;   Poor  Judgement:  Poor  Insight:  Lacking  Psychomotor Activity:  Normal  Concentration:  Concentration: Fair  Recall:  [employer]  Fund of Knowledge:  Fair  Language:  Fair  Akathisia:  No  Handed:  Right  AIMS (if indicated):     Assets:  Communication Skills Desire for Improvement Physical Health Resilience  ADL's:  Impaired  Cognition:  WNL  Sleep:  Number of Hours: 6.75     Treatment Plan Summary: Daily contact with patient to assess and evaluate symptoms and progress in treatment and Medication management   Patient to continue with current treatment plan on 05/11/2017 except where noted  Agitation Protocol made available see chart  Continue Invega 6 mg  for mood stabilization Continue with Trazodone 50 mg for insomnia - Staff to continue to encourage medication and hygiene    Will continue to monitor vitals ,medication compliance and treatment side effects while patient is here.   CSW will start working on disposition.  Patient to  participate in therapeutic milieu   Oneta Rackanika N Lewis, NP 05/11/2017, 11:05 AM   Agree with NP Progress Note

## 2017-05-11 NOTE — Plan of Care (Signed)
Problem: Role Relationship: Goal: Ability to interact with others will improve Intervention: Patient is encouraged to attend groups and be up in the milieu. Outcome: Patient is interacting with peers in the dayroom. 05/11/2017 10:13 AM - Progressing by Ferrel Loganollazo, Virtie Bungert A, RN   Problem: Safety: Goal: Ability to remain free from injury will improve Intervention: Patient contracts for safety on the unit. High fall risk precautions in place. Safety monitored with q15 minute checks. Outcome: Patient remains safe on the unit at this time. 05/11/2017 10:13 AM - Progressing by Ferrel Loganollazo, Able Malloy A, RN

## 2017-05-11 NOTE — [facility] Group Notes (Signed)
[facility] LCSW Group Therapy Note  Date/Time: 05/11/17, 1100  Type of Therapy/Topic:  Group Therapy:  Feelings about Diagnosis  Participation Level:  Active   Mood: pleasant   Description of Group:    This group will allow patients to explore their thoughts and feelings about diagnoses they have received. Patients will be guided to explore their level of understanding and acceptance of these diagnoses. Facilitator will encourage patients to process their thoughts and feelings about the reactions of others to their diagnosis, and will guide patients in identifying ways to discuss their diagnosis with significant others in their lives. This group will be process-oriented, with patients participating in exploration of their own experiences as well as giving and receiving support and challenge from other group members.   Therapeutic Goals: 1. Patient will demonstrate understanding of diagnosis as evidence by identifying two or more symptoms of the disorder:  2. Patient will be able to express two feelings regarding the diagnosis 3. Patient will demonstrate ability to communicate their needs through discussion and/or role plays  Summary of Patient Progress:Pt was very active participant in group.  Pt refused to give CSW his real name and asked to be called "Jordan Gomez."  Pt was frequently responding to another group member who was make random comments and this pt would talk somewhat at length regarding whatever subject was raised by the other group member.  Pt did share at one point that he does not have a mental health diagnosis and is only here due to "behavioral concerns" brought on by "not paying attention."  Pt reported he is not going to take any medications because that is not what the problem in his situation is related to.        Therapeutic Modalities:   Cognitive Behavioral Therapy Brief Therapy Feelings Identification   Jordan SquibbGreg Zitlali Primm, LCSW

## 2017-05-11 NOTE — Progress Notes (Signed)
Adult Psychoeducational Group Note  Date:  05/11/2017 Time:  1600  Group Topic/Focus: Patients were asked to answer various questions at random and discuss the topics with the group. Healthy Interaction: The focus of this group is to practice communication, appropriate interaction with peers, as well as healthy discussion.  Participation Level:  Active  Participation Quality:  Redirectable  Affect:  Blunted  Cognitive:  Alert and Oriented  Insight: Lacking and Limited  Engagement in Group:  Developing/Improving  Modes of Intervention:  Activity, Discussion, Socialization and Support  Additional Comments:  Patient did contribute when it was his turn and was supportive of peers. Patient required redirection by staff at times for provide silly/sarcastic answers but overall was appropriate during group.  Rae Lipsmanda A Ruble Pumphrey 05/11/2017, 3:18 PM

## 2017-05-11 NOTE — Plan of Care (Signed)
Problem: Activity: Goal: Interest or engagement in activities will improve Intervention: Patient has been provided a daily scheduled and is encouraged by staff to attend groups. Outcome: Patient is visible on the unit, interactive with peers/staff and attending groups. 05/11/2017 6:20 PM - Progressing by Ferrel Loganollazo, Vonita Calloway A, RN

## 2017-05-11 NOTE — Progress Notes (Signed)
Patients were taken out to the courtyard for their recreation time. Per the nursing student, he witnessed the patient place something on the ground. The nursing student picked up the item which contained a sewing needle and thread that was rolled into toilet paper. When the patients returned, the nursing student reported the contraband item to the primary nurse Hydrographic surveyor(writer). Writer assessed patient for injuries and discovered none. Patient skin was completely intact with no marks. MHT searched the patient's room for other contraband and found none. Patient reports he had no intention to use the contraband to harm himself or others. Patient denies having any other contraband at this time. Patient reports he has had the needle "since I got here, [facility] Patient reports he "knows the consequences" of having contraband and contracts for safety with staff. Writer reported incident to Press photographercharge nurse and [facility]

## 2017-05-11 NOTE — Plan of Care (Signed)
  Problem: Coping: Goal: Coping ability will improve Outcome: Progressing   Problem: Role Relationship: Goal: Ability to interact with others will improve Outcome: Progressing   Problem: Safety: Goal: Ability to remain free from injury will improve Outcome: Progressing

## 2017-05-11 NOTE — Progress Notes (Signed)
Nursing Progress Note [phone]  Data: Patient is minimal with writer this morning when prompted but calm and cooperative. Patient observed doing puzzles in the dayroom. Patient refused morning medications from Clinical research associatewriter. Provider notified. Patient states, "I've told ya'll I don't need to be here". Patient states goal for today is to "go home". Patient denies SI/HI/AVH or pain. Patient contracts for safety on the unit at this time. Patient provided self-inventory sheet but did not complete coherently. Patient provided reeducation about the unit rules and policies regarding contraband items. Patient has been observed making sexually inappropriate comments to peers in the dayroom. Patient redirected by staff.  Action: Patient educated about and provided medication per provider's orders. Patient safety maintained with q15 min safety checks. Low fall risk precautions in place. Emotional support given. 1:1 interaction and active listening provided. Patient encouraged to attend meals and groups. Labs, vital signs and patient behavior monitored throughout shift. Patient encouraged to work on treatment plan and goals.  Response: Patient remains safe on the unit at this time. Patient Will continue to support and monitor.

## 2017-05-12 DIAGNOSIS — R451 Restlessness and agitation: Secondary | ICD-10-CM

## 2017-05-12 NOTE — Progress Notes (Signed)
[facility] MD Progress Note  05/12/2017 3:41 PM Jordan Gomez  MRN:  [id number] Subjective:    Jordan Gomez is a 27 y/o M with unknown psychiatric history who was admitted on IVC from [facility] after he was brought in by [facility] with symptoms of psychosis including bizarre behaviors of standing in traffic, disorganized speech, poor hygiene, and poor insight. Pt also had BAL of 116 at time of admission. He was started on Risperdal 1mg  po BID in the ED (which he was been refusing) and then transferred to [facility] for additional treatment and evaluation. Pt was pleasant on initial interview but disorganized and tangential with his thoughts. He agreed to be started on trial of Invega, but he continued to refuse that medication during his stay. Pt has been calm and cooperative on the unit, but he has continued to refuse all psychotropic medications.  Today upon evaluation, pt shares, "I'm pretty good." He denies any specific concerns. He is sleeping well. His appetite is good. He denies other physical complaints. He denies SI/HI/AH/VH. He remains disorganized mildly in his speech; he is vague and circumstantial in his responses. He is calm and cooperative, however. There is no dangerous aspects of his behaviors. Discussed with patient about potential benefits of trial of medication during hi stay, and pt continues to decline medications. Discussed with patient that we will likely anticipate discharge tomorrow if he continues to refuse medications as there is no benefit to continuing his admission, and pt was in agreement with that plan.  Principal Problem: Acute psychosis (HCC) Diagnosis:   Patient Active Problem List   Diagnosis Date Noted  . 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 Acute psychosis (HCC) [F23] 05/08/2017   Total Time spent with patient: 30 minutes  Past Psychiatric History: see H&P  Past Medical History: History reviewed. No pertinent past medical history. History reviewed. No pertinent  surgical history. Family History: History reviewed. No pertinent family history. Family Psychiatric  History: see H&P Social History:  Social History   Substance and Sexual Activity  Alcohol Use Yes  . Frequency: Never   Comment: unable to answer     Social History   Substance and Sexual Activity  Drug Use Yes  . Types: Marijuana   Comment: every now and then, [facility]    Social History   Socioeconomic History  . Marital status: Single    Spouse name: Not on file  . Number of children: Not on file  . Years of education: Not on file  . Highest education level: Not on file  Occupational History  . Not on file  Social Needs  . Financial resource strain: Not on file  . Food insecurity:    Worry: Not on file    Inability: Not on file  . Transportation needs:    Medical: Not on file    Non-medical: Not on file  Tobacco Use  . Smoking status: Former Smoker    Types: Cigarettes  . Smokeless tobacco: Never Used  Substance and Sexual Activity  . Alcohol use: Yes    Frequency: Never    Comment: unable to answer  . Drug use: Yes    Types: Marijuana    Comment: every now and then, THC  . Sexual activity: Not Currently  Lifestyle  . Physical activity:    Days per week: Not on file    Minutes per session: Not on file  . Stress: Not on file  Relationships  . Social connections:    Talks on phone:  Not on file    Gets together: Not on file    Attends religious service: Not on file    Active member of club or organization: Not on file    Attends meetings of clubs or organizations: Not on file    Relationship status: Not on file  Other Topics Concern  . Not on file  Social History Narrative  . Not on file   Additional Social History:                         Sleep: Good  Appetite:  Good  Current Medications: Current Facility-Administered Medications  Medication Dose Route Frequency Provider Last Rate Last Dose  . acetaminophen (TYLENOL) tablet 650 mg  650  mg Oral Q6H PRN Charm Rings, NP      . alum & mag hydroxide-simeth (MAALOX/MYLANTA) 200-200-20 MG/5ML suspension 30 mL  30 mL Oral Q4H PRN Charm Rings, NP      . hydrOXYzine (ATARAX/VISTARIL) tablet 50 mg  50 mg Oral Q6H PRN Micheal Likens, MD      . OLANZapine zydis (ZYPREXA) disintegrating tablet 10 mg  10 mg Oral Q8H PRN Micheal Likens, MD       And  . LORazepam (ATIVAN) tablet 1 mg  1 mg Oral PRN Micheal Likens, MD       And  . ziprasidone (GEODON) injection 20 mg  20 mg Intramuscular PRN Micheal Likens, MD      . magnesium hydroxide (MILK OF MAGNESIA) suspension 30 mL  30 mL Oral Daily PRN Charm Rings, NP      . paliperidone (INVEGA) 24 hr tablet 6 mg  6 mg Oral Daily Micheal Likens, MD      . traZODone (DESYREL) tablet 50 mg  50 mg Oral QHS PRN Micheal Likens, MD        Lab Results: No results found for this or any previous visit (from the past 48 hour(s)).  Blood Alcohol level:  Lab Results  Component Value Date   ETH 116 (H) 05/06/2017   ETH <10 02/01/2017    Metabolic Disorder Labs: No results found for: HGBA1C, MPG No results found for: PROLACTIN No results found for: CHOL, TRIG, HDL, CHOLHDL, VLDL, LDLCALC  Physical Findings: AIMS: Facial and Oral Movements Muscles of Facial Expression: None, normal Lips and Perioral Area: None, normal Jaw: None, normal Tongue: None, normal,Extremity Movements Upper (arms, wrists, hands, fingers): None, normal Lower (legs, knees, ankles, toes): None, normal, Trunk Movements Neck, shoulders, hips: None, normal, Overall Severity Severity of abnormal movements (highest score from questions above): None, normal Incapacitation due to abnormal movements: None, normal Patient's awareness of abnormal movements (rate only patient's report): No Awareness, Dental Status Current problems with teeth and/or dentures?: No Does patient usually wear dentures?: No  CIWA:     COWS:     Musculoskeletal: Strength & Muscle Tone: within normal limits Gait & Station: normal Patient leans: N/A  Psychiatric Specialty Exam: Physical Exam  Nursing note and vitals reviewed.   Review of Systems  Constitutional: Negative for chills and fever.  Respiratory: Negative for cough and shortness of breath.   Cardiovascular: Negative for chest pain.  Gastrointestinal: Negative for abdominal pain, heartburn, nausea and vomiting.  Psychiatric/Behavioral: Negative for depression, hallucinations and suicidal ideas. The patient is not nervous/anxious and does not have insomnia.     Blood pressure 119/61, pulse 73, temperature 97.6 F (36.4 C), temperature source Oral,  resp. rate 18, height 5\' 4"  (1.626 m), weight 53.5 kg (118 lb).Body mass index is 20.25 kg/m.  General Appearance: Casual and Fairly Groomed  Eye Contact:  Good  Speech:  Clear and Coherent and Normal Rate  Volume:  Normal  Mood:  Euthymic  Affect:  Blunt  Thought Process:  Coherent, Disorganized, Goal Directed and Descriptions of Associations: Loose  Orientation:  Full (Time, Place, and Person)  Thought Content:  Illogical, Tangential and Abstract Reasoning  Suicidal Thoughts:  No  Homicidal Thoughts:  No  Memory:  Immediate;   Fair Recent;   Fair Remote;   Fair  Judgement:  Poor  Insight:  Lacking  Psychomotor Activity:  Normal  Concentration:  Concentration: Fair  Recall:  FiservFair  Fund of Knowledge:  Fair  Language:  Fair  Akathisia:  No  Handed:    AIMS (if indicated):     Assets:  Communication Skills Resilience Social Support  ADL's:  Intact  Cognition:  WNL  Sleep:  Number of Hours: 6.25   Treatment Plan Summary: Daily contact with patient to assess and evaluate symptoms and progress in treatment and Medication management   -Continue inpatient hospitalization  -Psychosis, unspecified   - Continue Invega 6mg  po qDay  -Anxiety   - Continue atarax 50mg  po q6h prn anxiety  -  Agitation   -Continue zydis/ativan/geodon protocol  - Insomnia   - Continue trazodone 50mg  po qhs prn insomnia  -Encourage participation in groups and therapeutic milieu  -Disposition planning will be ongoing  Micheal Likenshristopher T Gladies Sofranko, MD 05/12/2017, 3:41 PM

## 2017-05-12 NOTE — Progress Notes (Signed)
DAR NOTE: Patient presents with calm affect and mood.  Denies suicidal thoughts, pain, auditory and visual hallucinations.  Rates depression at 0, hopelessness at 0, and anxiety at 0.  Maintained on routine safety checks.  Refused prescribed medications after several encouragements.  Patient states nothing is wrong with him and doesn't need medication.  Support and encouragement offered as needed.  Attended group and participated.  Patient observed socializing with peers in the dayroom.  Offered no complaint.

## 2017-05-12 NOTE — [facility] Group Notes (Signed)
LCSW Group Therapy Note   05/12/2017 1:15pm   Type of Therapy and Topic:  Group Therapy:  Overcoming Obstacles   Participation Level:  Active   Description of Group:    In this group patients will be encouraged to explore what they see as obstacles to their own wellness and recovery. They will be guided to discuss their thoughts, feelings, and behaviors related to these obstacles. The group will process together ways to cope with barriers, with attention given to specific choices patients can make. Each patient will be challenged to identify changes they are motivated to make in order to overcome their obstacles. This group will be process-oriented, with patients participating in exploration of their own experiences as well as giving and receiving support and challenge from other group members.   Therapeutic Goals: 1. Patient will identify personal and current obstacles as they relate to admission. 2. Patient will identify barriers that currently interfere with their wellness or overcoming obstacles.  3. Patient will identify feelings, thought process and behaviors related to these barriers. 4. Patient will identify two changes they are willing to make to overcome these obstacles:      Summary of Patient Progress   Stayed the entire time, engaged for the first half, but his demeanor changed after that after he was asked more specifics about his situation.  He appeared more shut down and withdrawan, and declined to answer any more. Although he contributed a lot to the conversation early on, it all came to a halt after he stated that he anticipated no obstacles, and that he only saw "smooth sailing" ahead.   Therapeutic Modalities:   Cognitive Behavioral Therapy Solution Focused Therapy Motivational Interviewing Relapse Prevention Therapy  Ida RogueRodney B Dulcie Gammon, LCSW 05/12/2017 3:05 PM

## 2017-05-12 NOTE — Progress Notes (Signed)
Recreation Therapy Notes   Date: 4.8.19 Time: 10:00 a.m. Location: [address] Dayroom   Group Topic: Healthy Support Systems   Goal Area(s) Addresses:  Goal 1.1: To increase awareness of a healthy support system - Patient will identify the importance of a healthy support system - Patient will identify their own support system  - Patient will identify ways on how to improve their support system Goal 2.1: To improve communication  - Patient will participate in opening discussion  - Patient will communicate with peers during team building activity  - Patient will participate in final discussion   Behavioral Response: Appropriate, Engaged   Intervention: STEM  Activity: Straw Tower: Patients must construct a tower using twenty-five straws and masking tape. Recreation Therapy Intern tested the stability of their tower by placing a book on top of their tower. Afterwards, Recreation Therapy Intern begun processing with group about building a healthy support system, and how communication is needed throughout the process.   Education: Academic librarian, Communication   Education Outcome: Acknowledges Education  Clinical Observations/Feedback: Patient attended and participated appropriately during Recreation Therapy group session. Patient was able to identify their own support system. Patient communicated appropriately with peers to complete group activity. Patient participated during opening and closing discussion. Patient successfully met Goal 1.1 (see above) and Goal 2.1 (see above).   Ranell Patrick, Recreation Therapy Intern   Ranell Patrick 05/12/2017 11:30 AM

## 2017-05-12 NOTE — Plan of Care (Signed)
  Problem: Safety: Goal: Periods of time without injury will increase Outcome: Progressing   

## 2017-05-12 NOTE — Progress Notes (Signed)
D: Pt denies SI/HI/AVH. Pt is pleasant and cooperative. Pt continues to refuse medications, but pt has been appropriate on the unit this evening.   A: Pt was offered support and encouragement. Pt was given scheduled medications. Pt was encourage to attend groups. Q 15 minute checks were done for safety.   R:Pt attends groups and interacts well with peers and staff. Pt is no  taking medication. Pt not  receptive to treatment and safety maintained on unit.

## 2017-05-12 NOTE — Progress Notes (Signed)
Adult Psychoeducational Group Note  Date:  05/12/2017 Time:  9:52 PM  Group Topic/Focus:  Wrap-Up Group:   The focus of this group is to help patients review their daily goal of treatment and discuss progress on daily workbooks.  Participation Level:  None  Participation Quality:  none  Affect:  Labile  Cognitive:  Oriented  Insight: Appropriate  Engagement in Group:  Resistant  Modes of Intervention:  Socialization and Support  Additional Comments:  Patient attended however, did not participated in group tonight.  Lita MainsFrancis, Zakry Caso [facility] 05/12/2017, 9:52 PM

## 2017-05-13 DIAGNOSIS — F1010 Alcohol abuse, uncomplicated: Secondary | ICD-10-CM

## 2017-05-13 MED ORDER — TRAZODONE HCL 50 MG PO TABS: 50.0000 mg | ORAL_TABLET | Freq: Every evening | ORAL | 0 refills | Status: DC | PRN

## 2017-05-13 MED ORDER — PALIPERIDONE ER 6 MG PO TB24: 6.0000 mg | ORAL_TABLET | Freq: Every day | ORAL | 0 refills | Status: DC

## 2017-05-13 MED ORDER — HYDROXYZINE HCL 50 MG PO TABS: 50.0000 mg | ORAL_TABLET | Freq: Four times a day (QID) | ORAL | 0 refills | Status: DC | PRN

## 2017-05-13 NOTE — Progress Notes (Signed)
Recreation Therapy Notes  Date: 4.9.19 Time: 10:00 a.m. Location: [address] Dayroom   Group Topic: Communication   Goal Area(s) Addresses:  Goal 1.1: To improve communication  - Group will communicate with peers during group session    - Group will identify the importance of healthy communication  - Group will answer at least three questions during Recreation Therapy tx  Behavioral Response: Engaged   Intervention: Game   Activity: Jenga: Patients played Jenga as normal. Once a patient pulled out a [place] piece, based on the color of the skittle, the patient answered a specific question for that color.   Education: Communication, Team-Work   Education Outcome: Acknowledges education  Clinical Observations/Feedback: Patient attended and participated appropriately in Recreation Therapy group treatment successfully engaging in group activity. Patient was able to communicate with peers in a appropriate manner with redirection from Recreation Therapy Intern. Patient was able to identify the importance of communication. Patient was able to answer at least three questions about communication during Recreation Therapy Group treatment. Patient successfully met Goal 1.1 (see above).   Ranell Patrick, Recreation Therapy Intern   Ranell Patrick 05/13/2017 11:25 AM

## 2017-05-13 NOTE — [facility] Suicide Risk Assessment (Signed)
[facility]   Principal Problem: Acute psychosis [facility](HCC) Discharge Diagnoses:  Patient Active Problem List   Diagnosis Date Noted  . 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 Acute psychosis (HCC) [F23] 05/08/2017    Total Time spent with patient: 30 minutes  Musculoskeletal: Strength & Muscle Tone: within normal limits Gait & Station: normal Patient leans: N/A  Psychiatric Specialty Exam: Review of Systems  Constitutional: Negative for chills and fever.  Respiratory: Negative for cough and shortness of breath.   Cardiovascular: Negative for chest pain.  Gastrointestinal: Negative for abdominal pain, heartburn, nausea and vomiting.  Psychiatric/Behavioral: Negative for depression, hallucinations and suicidal ideas. The patient is not nervous/anxious and does not have insomnia.     Blood pressure 121/71, pulse 64, temperature 98.2 F (36.8 C), temperature source Oral, resp. rate 18, height 5\' 4"  (1.626 m), weight 53.5 kg (118 lb).Body mass index is 20.25 kg/m.  General Appearance: Casual and Fairly Groomed  Patent attorneyye Contact::  Good  Speech:  Clear and Coherent and Normal Rate  Volume:  Normal  Mood:  Euthymic  Affect:  Appropriate and Congruent  Thought Process:  Coherent, Disorganized and Descriptions of Associations: Loose  Orientation:  Full (Time, Place, and Person)  Thought Content:  Illogical, Tangential and Abstract Reasoning  Suicidal Thoughts:  No  Homicidal Thoughts:  No  Memory:  Immediate;   Fair Recent;   Fair Remote;   Fair  Judgement:  Poor  Insight:  Lacking  Psychomotor Activity:  Normal  Concentration:  Fair  Recall:  FiservFair  Fund of Knowledge:Fair  Language: Fair  Akathisia:  No  Handed:    AIMS (if indicated):     Assets:  Communication Skills Resilience Social Support  Sleep:  Number of Hours: 5.75  Cognition: WNL  ADL's:  Intact   Mental Status Per Nursing Assessment::   On Admission:     Demographic Factors:  Adolescent  or young adult, Low socioeconomic status and Unemployed  Loss Factors: Financial problems/change in socioeconomic status  Historical Factors: Impulsivity  Risk Reduction Factors:   Positive coping skills or problem solving skills  Continued Clinical Symptoms:  Currently Psychotic  Cognitive Features That Contribute To Risk:  Closed-mindedness    Suicide Risk:  Minimal: No identifiable suicidal ideation.  Patients presenting with no risk factors but with morbid ruminations; may be classified as minimal risk based on the severity of the depressive symptoms  Follow-up Information    Patient declined all follow-up Follow up.         Subjective Data:  Jordan Gomez is a 27 y/o M with unknown psychiatric history who [facility] of [facility] including bizarre behaviors of standing in traffic, disorganized speech, poor hygiene, and poor insight. Pt also had BAL of 116 at time of admission. He was started on Risperdal 1mg  po BID in the ED (which he was been refusing) and [facility] Pt was pleasant on initial interview but disorganized and tangential with his thoughts. He agreed to be started on trial of Invega, but he continued to refuse that medication during his stay. Pt has been calm and cooperative on the unit, but he has continued to refuse all psychotropic medications.  Today upon evaluation, pt shares, "Things are going okay." He has no specific concerns. He denies physical complaints. He is sleeping well. His appetite is good. He denies SI/HI/AH/VH. His thought  process continues to be disorganized, tangential at times, and abstract. He continues to decline all medications at this time. He would like to discharge today. He was able to engage in safety planning including plan to return to [facility] or contact emergency services if he feels unable to maintain his own  safety or the safety of others. Pt had no further questions, comments, or concerns.   Plan Of Care/Follow-up recommendations:   -Discharge to outpatient level of care  -Pt declines all medications and referral to outpatient follow up  Activity:  as tolerated Diet:  normal Tests:  NA Other:  see above for DC plan  Micheal Likens, MD 05/13/2017, 9:52 AM

## 2017-05-13 NOTE — Progress Notes (Signed)
  [facility] Discharge Plan :  Will you be returning to the same living situation after discharge:  Yes,  tent in [place] At discharge, do you have transportation home?: Yes,  bus pass Do you have the ability to pay for your medications: Yes,  no meds  Release of information consent forms completed and in the chart;  Patient's signature needed at discharge.  Patient to Follow up at: Follow-up Information    Patient declined all follow-up Follow up.           Next level of care provider has access to [facility] Link:yes  Safety Planning and Suicide Prevention discussed: Yes,  yes  Have you used any form of tobacco in the last 30 days? (Cigarettes, Smokeless Tobacco, Cigars, and/or Pipes): No  Has patient been referred to the Quitline?: N/A patient is not a smoker  Patient has been referred for addiction treatment: Pt. refused referral  Ida RogueRodney B Travell Desaulniers, LCSW 05/13/2017, 8:49 AM

## 2017-05-13 NOTE — Discharge Summary (Addendum)
Physician Discharge Summary Note  Patient:  Jordan Gomez is an 27 y.o., male MRN:  [id number] DOB:  1990/09/19 Patient phone:  [phone] (home)  Patient address:   [place] [address],  Total Time spent with patient: 30 minutes  Date of Admission:  05/08/2017 Date of Discharge: 05/13/2017  Reason for Admission:  Jordan Gomez is a 27 y/o M with unknown psychiatric history who was admitted on IVC from [facility] after he was brought in by [facility] with symptoms of psychosis including bizarre behaviors of standing in traffic, disorganized speech, poor hygiene, and poor insight. Pt also had BAL of 116 at time of admission. He was started on Risperdal 1mg  po BID in the ED (which he has been refusing) and then transferred to [facility] for additional treatment and evaluation.   Upon initial interview, pt shares, "I drank a little bit that morning and I was in [employer], and then I was leaving with some other people and I saw this guy trying to cross the street, and I saw the light was going to change, so I decided to stand in the cross-walk to be sure he could make it, but I guess he didn't want my help. So I kept standing there and the cops showed up, and then I decided to run." Pt is generally disorganized and a poor historian. He makes several vague, circumstantial,  and tangential statements. He indicates that he has been living in a tent with another individual and he has been experiencing worsening symptoms of psychosis including AH and VH. Pt shares that he sees people and he is unsure if they are real. He states, "I keep trying to tell what's real and what's not." He denies symptoms of depression. He denies SI/HI. He denies symptoms of mania, OCD, and PTSD. He reports using alcohol about 1-2 times per week and on those occassions he has 10-15 beers and 4-6 shots of hard alcohol. He was using cannabis about 3 times per week until about 1 week ago. He denies other illicit substance  use.  Discussed with patient about treatment options. He has been refusing risperdal, and discussed with patient about an alternative medication to help with is symptoms and thought organization. Pt is in agreement to trial of Invega with tentative plan to transition to long-acting injectable form if he has good tolerability and efficacy. Pt had no further questions, comments, or concerns.  Associated Signs/Symptoms: Depression Symptoms:  anxiety, (Hypo) Manic Symptoms:  Distractibility, Hallucinations, Anxiety Symptoms:  NA Psychotic Symptoms:  Hallucinations: Auditory Visual PTSD Symptoms: NA   Principal Problem: Acute psychosis [facility]) Discharge Diagnoses: Patient Active Problem List   Diagnosis Date Noted  . 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 Acute psychosis (HCC) [F23] 05/08/2017   Past Psychiatric History:  - no previous dx - no previous inpatient stays - no current outpatient provider - no suicide attempt history  Past Medical History: History reviewed. No pertinent past medical history. History reviewed. No pertinent surgical history. Family History: History reviewed. No pertinent family history. Family Psychiatric  History: mother had suicide attempt    Social History:  Pt was raised in [place] and he has lived in [place] for about 4 months. He is homeless and stays in a tent with a friend. He does not work. He does not have children. He does not have legal or trauma history.  Social History   Substance and Sexual Activity  Alcohol Use Yes  . Frequency: Never   Comment: unable to answer  Social History   Substance and Sexual Activity  Drug Use Yes  . Types: Marijuana   Comment: every now and then, [facility]    Social History   Socioeconomic History  . Marital status: Single    Spouse name: Not on file  . Number of children: Not on file  . Years of education: Not on file  . Highest education level: Not on file  Occupational History  . Not on file   Social Needs  . Financial resource strain: Not on file  . Food insecurity:    Worry: Not on file    Inability: Not on file  . Transportation needs:    Medical: Not on file    Non-medical: Not on file  Tobacco Use  . Smoking status: Former Smoker    Types: Cigarettes  . Smokeless tobacco: Never Used  Substance and Sexual Activity  . Alcohol use: Yes    Frequency: Never    Comment: unable to answer  . Drug use: Yes    Types: Marijuana    Comment: every now and then, THC  . Sexual activity: Not Currently  Lifestyle  . Physical activity:    Days per week: Not on file    Minutes per session: Not on file  . Stress: Not on file  Relationships  . Social connections:    Talks on phone: Not on file    Gets together: Not on file    Attends religious service: Not on file    Active member of club or organization: Not on file    Attends meetings of clubs or organizations: Not on file    Relationship status: Not on file  Other Topics Concern  . Not on file  Social History Narrative  . Not on file    Hospital Course: [name] is a 27 y/o M with unknown psychiatric history who was [facility] from [facility] symptoms of psychosis including bizarre behaviors of standing in traffic, disorganized speech, poor hygiene, and poor insight. Pt also had BAL of 116 at time of admission.   Jordan Gomez was admitted to the hospital with his UDS reports positive for multiple substances that included; THC. He was also enrolled in the group counseling sessions and  meetings being offered and held on this unit. He learned coping skills and behavior modifications.  Besides the treatments for crisis stabilization, Jordan Gomez was medicated with in Vega 6 mg, hydroxyzine 50 mg p.o. every 6 as needed for anxiety, and trazodone 50 mg as needed at bedtime.  He was started on agitation protocol however did not have to receive any additional medical to control  agitation or aggression.   He presented no other medical issues that required treatment and or monitoring. He tolerated his treatment regimen without any significant adverse effects and or reactions.   Once patient was stabilized he had immediate improvement in his mood and affect.  He was active on the unit and participating in groups.  He was given a prescription for medications he received while on the unit.  He also declined medication management and routine psychiatric care follow-up at this time.  He does verbalize understanding that he is now responsible for following up with medications once discharged from the facility.   Upon discharge, he adamantly denies any SIHI, AVH, delusional thoughts, paranoia and or withdrawal symptoms. [facility] personal belongings in no apparent distress.   Physical Findings: AIMS: Facial  and Oral Movements Muscles of Facial Expression: None, normal Lips and Perioral Area: None, normal Jaw: None, normal Tongue: None, normal,Extremity Movements Upper (arms, wrists, hands, fingers): None, normal Lower (legs, knees, ankles, toes): None, normal, Trunk Movements Neck, shoulders, hips: None, normal, Overall Severity Severity of abnormal movements (highest score from questions above): None, normal Incapacitation due to abnormal movements: None, normal Patient's awareness of abnormal movements (rate only patient's report): No Awareness, Dental Status Current problems with teeth and/or dentures?: No Does patient usually wear dentures?: No  CIWA:    COWS:     Musculoskeletal: Strength & Muscle Tone: within normal limits Gait & Station: normal Patient leans: N/A  Psychiatric Specialty Exam: See MD SRA Physical Exam  ROS  Blood pressure 121/71, pulse 64, temperature 98.2 F (36.8 C), temperature source Oral, resp. rate 18, height 5\' 4"  (1.626 m), weight 53.5 kg (118 lb).Body mass index is 20.25 kg/m.     Have you used any form of tobacco in the  last 30 days? (Cigarettes, Smokeless Tobacco, Cigars, and/or Pipes): No  Has this patient used any form of tobacco in the last 30 days? (Cigarettes, Smokeless Tobacco, Cigars, and/or Pipes) Yes, Yes, A prescription for an FDA-approved tobacco cessation medication was offered at discharge and the patient refused  Blood Alcohol level:  Lab Results  Component Value Date   ETH 116 (H) 05/06/2017   ETH <10 02/01/2017    Metabolic Disorder Labs:  No results found for: HGBA1C, MPG No results found for: PROLACTIN No results found for: CHOL, TRIG, HDL, CHOLHDL, VLDL, LDLCALC  See Psychiatric Specialty Exam and Suicide Risk Assessment completed by Attending Physician prior to discharge.  Discharge destination:  Home  Is patient on multiple antipsychotic therapies at discharge:  No   Has Patient had three or more failed trials of antipsychotic monotherapy by history:  No  Recommended Plan for Multiple Antipsychotic Therapies: NA  Discharge Instructions    Discharge instructions   Complete by:  As directed    Please continue to take medications as directed. If your symptoms return, worsen, or persist please call your 911, report to local ER, or contact crisis hotline. Please do not drink alcohol or use any illegal substances while taking prescription medications. Please make sure you follow up with your appointments as directed. WIll need to draw Prolactin, CBC, and CMP if needed.     Allergies as of 05/13/2017   No Known Allergies     Medication List    TAKE these medications     Indication  hydrOXYzine 50 MG tablet Commonly known as:  ATARAX/VISTARIL Take 1 tablet (50 mg total) by mouth every 6 (six) hours as needed for anxiety.  Indication:  Feeling Anxious   paliperidone 6 MG 24 hr tablet Commonly known as:  INVEGA Take 1 tablet (6 mg total) by mouth daily. Start taking on:  05/14/2017  Indication:  Schizophrenia   traZODone 50 MG tablet Commonly known as:  DESYREL Take 1  tablet (50 mg total) by mouth at bedtime as needed for sleep.  Indication:  Trouble Sleeping      Follow-up Information    Patient declined all follow-up Follow up.           Follow-up recommendations:  Activity:  Increase activity as tolerated Diet:  Routine house diet Tests:  Routine test as needed.  Suggest ordering prolactin, CBC with differential, CMP. Other:  Will continue taking medications even if he began to feel better.  If taking medication daily  becomes too much for you can suggest taking long-acting injectable.  Please discussed all options with just outpatient psychiatrist   Signed: Truman Hayward, FNP 05/13/2017, 12:06 PM   Patient seen, Suicide Assessment Completed.  Disposition Plan Reviewed

## 2017-05-13 NOTE — Progress Notes (Signed)
Recreation Therapy Notes  INPATIENT RECREATION TR PLAN  Patient Details Name: Jordan Gomez MRN: [id number] DOB: 08/19/1990 Today's Date: 05/13/2017  Rec Therapy Plan Is patient appropriate for Therapeutic Recreation?: Yes Treatment times per week: At least three  Estimated Length of Stay: 5-7 days  TR Treatment/Interventions: Group participation (Appropriate participation in Rcreation Therapy group tx)  Discharge Criteria Pt will be discharged from therapy if:: Discharged Treatment plan/goals/alternatives discussed and agreed upon by:: Patient/family  Discharge Summary Short term goals set: See Care Plan  Short term goals met: Complete Progress toward goals comments: Groups attended Which groups?: Communication, Self-Expression, Healthy Support System  Therapeutic equipment acquired: None  Reason patient discharged from therapy: Discharge from hospital Pt/family agrees with progress & goals achieved: Yes Date patient discharged from therapy: 05/13/17   , Recreation Therapy Intern     05/13/2017, 11:29 AM  

## 2017-05-13 NOTE — Progress Notes (Signed)
Patient discharged to lobby. Patient was stable and appreciative at that time. All papers and prescriptions were given and valuables returned. Verbal understanding expressed. Denies SI/HI and A/VH. Patient given opportunity to express concerns and ask questions.  

## 2017-05-13 NOTE — Plan of Care (Signed)
4.9.19. Patient attended and participated appropriately during Recreation Therapy group session engaging in groups with a calm and appropriate mood at least 2x within 5 Recreation Therapy group sessions

## 2017-05-14 ENCOUNTER — Ambulatory Visit (HOSPITAL_COMMUNITY)
Admission: RE | Admit: 2017-05-14 | Discharge: 2017-05-14 | Disposition: A | Discharge status: Home / Self Care | Payer: Federal, State, Local not specified - Other | Attending: Psychiatry | Admitting: Psychiatry

## 2017-05-14 DIAGNOSIS — R451 Restlessness and agitation: Secondary | ICD-10-CM | POA: Insufficient documentation

## 2017-05-14 NOTE — BH Assessment (Signed)
Assessment Note  Jordan Gomez is an 27 y.o. male who presents voluntarily Saying, "I am here because I am thinking that maybe I should start to listen to the advice of others more". Pt is referring to the fact that [facility], and refused medication and treatment while here. Pt does not report any symptoms he would like to be treated for and denies SI, HI, AVH, and SA. Pt was very argumentative during assessment and vague about symptoms.   Pt identifies primary stressors as no family support and "no friends". He states that he knows that something is going on with him and he isn't where he wants to be in life". Pt identifies primary residence as homeless because he got in an argument with his roommate after being DC from the hospital. Pt's social history includes social use of alcohol; denies SA being a problem. Pt states that he does not work currently. Pt denies legal involvement. Pt denies abuse history.   Pt refuses OP treatment, "I don't want to make any Dr. Allegra Granarazy". Pt reports medication non-compliant, "What do I need those for"? Pt declines to give family MH/SA history.  Pt has poor insight and judgment. Pt's memory is typical.  ? MSE: Pt is casually dressed, alert, oriented x4 with normal speech and restless motor behavior. Eye contact is poor. Pt's mood is irritable and anxious. Affect is congruent with mood. Thought process is coherent and , but circumstantial. There is no indication that pt is currently responding to internal stimuli, but may be experiencing delusional thought content. Pt was cooperative at times during assessment.   Sh[place]on, NP, recommended outpatient psychiatric treatment due to pt not meeting IP criteria.  Protective factors against suicide include future orientations,  no access to firearms, no prior attempts, no SI, HI,) OP referrals given but refused.  Pt became agitated and upset when he was told that he did not meet IP  criteria and had to be escorted out by security. 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  No past surgical history on file.  Family History: No family history on file.  Social History:  reports that he has quit smoking. His smoking use included cigarettes. He has never used smokeless tobacco. He reports that he drinks alcohol. He reports that he has current or past drug history. Drug: Marijuana.  Additional Social History:  Alcohol / Drug Use Pain Medications: denies Prescriptions: denies Over the Counter: denies History of alcohol / drug use?: Yes Longest period of sobriety (when/how long): unable to assess Substance #1 Name of Substance 1: alcohol 1 - Last Use / Amount: 05/06/2017  CIWA: CIWA-Ar BP: 115/60 Pulse Rate: 73 COWS:    Allergies: No Known Allergies  Home Medications:  (Not in a hospital admission)  OB/GYN Status:  No LMP for male patient.  General Assessment Data [facility] TTS Assessment: In system Is this a Tele or Face-to-Face Assessment?: Face-to-Face Is this an Initial Assessment or a Re-assessment for this encounter?: Initial Assessment Marital status: Single Living Arrangements: Alone Can pt return to current living arrangement?: Yes Admission Status: Voluntary Is patient capable of signing voluntary admission?: No Referral Source: Self/Family/Friend Insurance type: MCD  Medical Screening Exam [facility] Medical Exam completed: Yes  Crisis Care Plan Living Arrangements: Alone Name of Psychiatrist: none  Name of Therapist: none  Education Status Is patient currently in school?: No Highest grade of school patient has completed: 12th  Is the patient employed, unemployed or receiving disability?: Unemployed  Risk to self with the past 6 months Suicidal  Ideation: No Has patient been a risk to self within the past 6 months prior to admission? : No Suicidal Intent: No Has patient had any suicidal intent within the past 6 months prior to admission? : No Is patient at risk for suicide?: No Suicidal Plan?: No Has patient had any suicidal plan within the past 6 months prior to admission? : No Access to Means: No What has been your use of drugs/alcohol within the last 12 months?: denies Previous Attempts/Gestures: No Intentional Self Injurious Behavior: None Family Suicide History: No Recent stressful life event(s): Financial Problems, Recent negative physical changes, Turmoil (Comment)(lost weight) Persecutory voices/beliefs?: Yes Depression: No Depression Symptoms: Insomnia, Feeling angry/irritable, Isolating Substance abuse history and/or treatment for substance abuse?: Yes Suicide prevention information given to non-admitted patients: Not applicable  Risk to Others within the past 6 months Homicidal Ideation: No Does patient have any lifetime risk of violence toward others beyond the six months prior to admission? : No Thoughts of Harm to Others: No Current Homicidal Intent: No Current Homicidal Plan: No Access to Homicidal Means: No History of harm to others?: No Assessment of Violence: None Noted Does patient have access to weapons?: No Criminal Charges Pending?: No Does patient have a court date: No Is patient on probation?: Unknown  Psychosis Hallucinations: None noted Delusions: None noted  Mental Status Report Appearance/Hygiene: Disheveled, Layered clothes, Body odor Eye Contact: Poor Motor Activity: Restlessness Speech: Logical/coherent, Argumentative Level of Consciousness: Alert Mood: Anxious, Depressed, Irritable Affect: Anxious, Angry, Irritable Anxiety Level: Moderate Thought Processes: Circumstantial, Coherent Judgement: Partial Orientation: Person, Place, Situation, Appropriate for developmental  age Obsessive Compulsive Thoughts/Behaviors: Moderate  Cognitive Functioning Concentration: Fair Memory: Recent Impaired, Remote Intact Is patient IDD: No Is patient DD?: No Insight: Poor Impulse Control: Fair Appetite: Fair Have you had any weight changes? : Loss Amount of the weight change? (lbs): (unk) Sleep: Decreased Total Hours of Sleep: (unknown) Vegetative Symptoms: Decreased grooming  ADLScreening [facility] Assessment Services) Patient's cognitive ability adequate to safely complete daily activities?: Yes Patient able to express need for assistance with ADLs?: Yes Independently performs ADLs?: Yes (appropriate for developmental age)  Prior Inpatient Therapy Prior Inpatient Therapy: Yes Prior Therapy Dates: 05/2017 Prior Therapy Facilty/Provider([facility] Reason for Treatment: psychosis  Prior Outpatient Therapy Prior Outpatient Therapy: No Does patient have an ACCT team?: No Does patient have Intensive In-House Services?  : No Does patient have [facility] services? : No Does patient have P4CC services?: No  ADL Screening (condition at time of admission) Patient's cognitive ability adequate to safely complete daily activities?: Yes Is the patient deaf or have difficulty hearing?: No Does the patient have difficulty seeing, even when wearing glasses/contacts?: No Does the patient have difficulty concentrating, remembering, or making decisions?: No Patient able to express need for assistance with ADLs?: Yes Does the patient have difficulty dressing or bathing?: No Independently performs ADLs?: Yes (appropriate for developmental age) Does the patient have difficulty walking or climbing stairs?: No Weakness of Legs: None Weakness of Arms/Hands: None  Home Assistive Devices/Equipment Home Assistive Devices/Equipment: None    Abuse/Neglect Assessment (Assessment to be complete while patient is alone) Abuse/Neglect Assessment Can Be Completed: Yes Physical Abuse:  Denies Verbal Abuse: Denies Sexual Abuse: Denies Exploitation of patient/patient's resources: Denies Self-Neglect:  Denies Values / Beliefs Cultural Requests During Hospitalization: None Spiritual Requests During Hospitalization: None Consults Spiritual Care Consult Needed: No Social Work Consult Needed: No Merchant navy officer (For Healthcare) Does Patient Have a Medical Advance Directive?: No Would patient like information on creating a medical advance directive?: No - Patient declined    Additional Information 1:1 In Past 12 Months?: No CIRT Risk: Yes Elopement Risk: Yes Does patient have medical clearance?: No     Disposition:  Disposition Initial Assessment Completed for this Encounter: Yes Disposition of Patient: Discharge Patient refused recommended treatment: Yes Mode of transportation if patient is discharged?: Walking Patient referred to: Outpatient clinic referral  On Site Evaluation by:   Reviewed with Physician:    Theo Dills 05/14/2017 5:29 PM

## 2017-05-14 NOTE — H&P (Signed)
Behavioral Health Medical Screening Exam  Jordan Gomez is an 27 y.o. male patient presents as walk in at [facility] stating he came back because he is more open to suggestions on what he should do.  Patient [facility].  Patient was given prescriptions for trazodone, Vistaril, and in [place].  Patient reports that he was not given any prescriptions and is not taking any medication.  Patient was also referred to an outpatient psychiatric provider but at time of discharge he refused information on outpatient services.  Patient reports that after his discharge when he get back to his residents who is no longer able to stay there but would not elaborate on the reason why.  Patient is now saying that he is willing to seek out outpatient psychiatric services if it is something that is going to make him better.  Printed a copy of patient's AVS that was given to him yesterday prior to his discharge; highlighted the medications that he should have gotten a prescription for; and gave information for outpatient psychiatric services for [facility] or [address], [facility].   At the time of this visit patient denies suicidal/self-harm/homicidal ideation, psychosis, and paranoia.   Total Time spent with patient: 30 minutes  Psychiatric Specialty Exam: Physical Exam  Vitals reviewed. Constitutional: He is oriented to person, place, and time. He appears well-developed and well-nourished.  HENT:  Head: Normocephalic.  Neck: Normal range of motion. Neck supple.  Respiratory: Effort normal.  Musculoskeletal: Normal range of motion.  Neurological: He is alert and oriented to person, place, and time.  Skin: Skin is warm and dry.    Review of Systems  All other systems reviewed and are negative.   Blood pressure 115/60, pulse 73, temperature (!) 97.3 F (36.3 C), resp. rate 16, SpO2 100 %.There is no height or weight on file to calculate BMI.  General  Appearance: Casual  Eye Contact:  Fair  Speech:  Clear and Coherent and Normal Rate  Volume:  Normal  Mood:  "better than I was"  Affect:  Constricted  Thought Process:  Linear  Orientation:  Full (Time, Place, and Person)  Thought Content:  Circumstantial  Suicidal Thoughts:  No  Homicidal Thoughts:  No  Memory:  Immediate;   Fair Recent;   Fair Remote;   Fair  Judgement:  Fair  Insight:  Shallow  Psychomotor Activity:  Normal  Concentration: Concentration: Fair and Attention Span: Fair  Recall:  Fis[employer]Fair  Fund of Knowledge:Fair  Language: Good  Akathisia:  No  Handed:  Right  AIMS (if indicated):     Assets:  Communication Skills  Sleep:       Musculoskeletal: Strength & Muscle Tone: within normal limits Gait & Station: normal Patient leans: N/A  Blood pressure 115/60, pulse 73, temperature (!) 97.3 F (36.3 C), resp. rate 16, SpO2 100 %.  Recommendations:  Outpatient psychiatric services  Based on my evaluation the patient does not appear to have an emergency medical condition.    Attempted to give patient resources information and a copy of his AVS from prior visit patient refused to take information stated he could be thrown in the trash and that he does not want outpatient services he wants to come back into the hospital patient then became agitated and had to get security to escort patient off premises.  Shuvon Rankin, NP 05/14/2017, 4:54 PM

## 2017-05-15 ENCOUNTER — Encounter (HOSPITAL_COMMUNITY): Payer: Self-pay | Admitting: Nurse Practitioner

## 2017-05-15 ENCOUNTER — Emergency Department (HOSPITAL_COMMUNITY)
Admission: EM | Admit: 2017-05-15 | Discharge: 2017-05-16 | Disposition: A | Discharge status: Home / Self Care | Payer: Self-pay | Attending: Emergency Medicine | Admitting: Emergency Medicine

## 2017-05-15 ENCOUNTER — Other Ambulatory Visit: Payer: Self-pay

## 2017-05-15 DIAGNOSIS — F29 Unspecified psychosis not due to a substance or known physiological condition: Secondary | ICD-10-CM | POA: Insufficient documentation

## 2017-05-15 DIAGNOSIS — F23 Brief psychotic disorder: Secondary | ICD-10-CM | POA: Insufficient documentation

## 2017-05-15 DIAGNOSIS — Z87891 Personal history of nicotine dependence: Secondary | ICD-10-CM | POA: Insufficient documentation

## 2017-05-15 DIAGNOSIS — F69 Unspecified disorder of adult personality and behavior: Secondary | ICD-10-CM | POA: Insufficient documentation

## 2017-05-15 DIAGNOSIS — F4325 Adjustment disorder with mixed disturbance of emotions and conduct: Secondary | ICD-10-CM

## 2017-05-15 DIAGNOSIS — F203 Undifferentiated schizophrenia: Secondary | ICD-10-CM | POA: Diagnosis present

## 2017-05-15 LAB — COMPREHENSIVE METABOLIC PANEL
ALBUMIN: 4.3 g/dL (ref 3.5–5.0)
ALK PHOS: 72 U/L (ref 38–126)
ALT: 23 U/L (ref 17–63)
AST: 23 U/L (ref 15–41)
Anion gap: 10 (ref 5–15)
BUN: 20 mg/dL (ref 6–20)
CALCIUM: 9.2 mg/dL (ref 8.9–10.3)
CHLORIDE: 103 mmol/L (ref 101–111)
CO2: 25 mmol/L (ref 22–32)
Creatinine, Ser: 0.84 mg/dL (ref 0.61–1.24)
GFR calc Af Amer: >60 mL/min (ref >60)
GFR calc non Af Amer: >60 mL/min (ref >60)
GLUCOSE: 83 mg/dL (ref 65–99)
Potassium: 4.1 mmol/L (ref 3.5–5.1)
SODIUM: 138 mmol/L (ref 135–145)
Total Bilirubin: 0.9 mg/dL (ref 0.3–1.2)
Total Protein: 7.6 g/dL (ref 6.5–8.1)

## 2017-05-15 LAB — RAPID URINE DRUG SCREEN, HOSP PERFORMED
Amphetamines: NOT DETECTED
Barbiturates: NOT DETECTED
Benzodiazepines: NOT DETECTED
Cocaine: NOT DETECTED
OPIATES: NOT DETECTED
Tetrahydrocannabinol: NOT DETECTED

## 2017-05-15 LAB — CBC
HEMATOCRIT: 40.4 % (ref 39.0–52.0)
HEMOGLOBIN: 14.7 g/dL (ref 13.0–17.0)
MCH: 30.4 pg (ref 26.0–34.0)
MCHC: 36.4 g/dL — AB (ref 30.0–36.0)
MCV: 83.5 fL (ref 78.0–100.0)
Platelets: 363 10*3/uL (ref 150–400)
RBC: 4.84 MIL/uL (ref 4.22–5.81)
RDW: 12.3 % (ref 11.5–15.5)
WBC: 8.5 10*3/uL (ref 4.0–10.5)

## 2017-05-15 LAB — SALICYLATE LEVEL

## 2017-05-15 LAB — ETHANOL: Alcohol, Ethyl (B): <10 mg/dL (ref <10)

## 2017-05-15 LAB — ACETAMINOPHEN LEVEL: Acetaminophen (Tylenol), Serum: <10 ug/mL — ABNORMAL LOW (ref 10–30)

## 2017-05-15 MED ORDER — OLANZAPINE 10 MG PO TBDP
10.0000 mg | ORAL_TABLET | Freq: Two times a day (BID) | ORAL | Status: DC | PRN
  Administered 2017-05-15: 10 mg via ORAL
  Filled 2017-05-15: qty 1

## 2017-05-15 NOTE — ED Notes (Signed)
Pt admitted to room #43, Pt noted talking to self, laughing inappropriately, disorganized, bizarre. Limited assessment d/t Pt unable to provide meaningful answesr to questions when asked by this nurse. Pt noted answering questions with questions. Encouragement and support provided. Special checks q 15 mins in place for safety, Video monitoring in place. Will continue to monitor.

## 2017-05-15 NOTE — Progress Notes (Signed)
Referral information has been sent to the following hospitals: [facility]  [facility]  [facility]  [facility]  [facility]  [facility]   Disposition will continue to assist with placement needs.   Wells GuilesSarah Korinne Greenstein, LCSW, LCAS Disposition CSW [facility]/TTS [phone]

## 2017-05-15 NOTE — ED Notes (Signed)
TTS at bedside. 

## 2017-05-15 NOTE — ED Notes (Signed)
Report given to [facility] RN 

## 2017-05-15 NOTE — ED Notes (Signed)
Patient seen in his room watching TV, laughing and talking to self. Cheerful and pleasant upon approach. Patient stated "I am doing well thanks". Denies pain, SI/HI, AH/VH at this time. Patient verbalized no concern. No behavior issues noted. Will continue to monitor patient. Safety checks maintained.

## 2017-05-15 NOTE — ED Provider Notes (Signed)
[facility]-EMERGENCY DEPT Provider Note   CSN: [id number] Arrival date & time: 05/15/17  1207     History   Chief Complaint No chief complaint on file.   HPI Jordan Gomez is a 27 y.o. male with previous documented history of psychosis is brought to the ED by [facility] police.  Patient is a poor historian, avoids eye contact, is smiling and laughing during questioning and often answers my questions with other questions.  History is limited due to this.  History obtained by triage note, triage nurse and chart review.  Reportedly, patient had been noticed to be following people around school all week.  Unknown if there was any observed aggression.  Patient states he does not know why he was here about a police officer "Bradly Bienenstock" brought him.  He denies psychiatric illness.  He denies illicit drug use, tobacco use.  Endorses occasional EtOH use.  When asked where [place]".  He is avoiding direct questioning, when asked if he is suicidal/homicidal, hallucinating he starts to laugh.  HPI  History reviewed. No pertinent past medical history.  Patient Active Problem List   Diagnosis Date Noted  . Psychosis (HCC) 05/08/2017  . Acute psychosis (HCC) 05/08/2017    History reviewed. No pertinent surgical history.      Home Medications    Prior to Admission medications   Medication Sig Start Date End Date Taking? Authorizing Provider  hydrOXYzine (ATARAX/VISTARIL) 50 MG tablet Take 1 tablet (50 mg total) by mouth every 6 (six) hours as needed for anxiety. Patient not taking: Reported on 05/15/2017 05/13/17   Truman Hayward, FNP  paliperidone (INVEGA) 6 MG 24 hr tablet Take 1 tablet (6 mg total) by mouth daily. Patient not taking: Reported on 05/15/2017 05/14/17   Truman Hayward, FNP  traZODone (DESYREL) 50 MG tablet Take 1 tablet (50 mg total) by mouth at bedtime as needed for sleep. Patient not taking: Reported on 05/15/2017 05/13/17   Truman Hayward, FNP    Family History History reviewed. No pertinent family history.  Social History Social History   Tobacco Use  . Smoking status: Former Smoker    Types: Cigarettes  . Smokeless tobacco: Never Used  Substance Use Topics  . Alcohol use: Yes    Frequency: Never    Comment: unable to answer  . Drug use: Yes    Types: Marijuana    Comment: every now and then, THC     Allergies   Patient has no known allergies.   Review of Systems Review of Systems  Psychiatric/Behavioral: Positive for behavioral problems.  All other systems reviewed and are negative.    Physical Exam Updated Vital Signs BP 112/78   Pulse 74   Temp 98.5 F (36.9 C)   Resp 18   Ht 5\' 4"  (1.626 m)   Wt 57 kg (125 lb 9.6 oz)   SpO2 99%   BMI 21.56 kg/m   Physical Exam  Constitutional: He is oriented to person, place, and time. He appears well-developed and well-nourished. No distress.  NAD. Poor hygiene.  Asleep but arousable.  HENT:  Head: Normocephalic and atraumatic.  Right Ear: External ear normal.  Left Ear: External ear normal.  Nose: Nose normal.  Moist mucous membranes.  Eyes: Conjunctivae and EOM are normal. No scleral icterus.  Neck: Normal range of motion.  Cardiovascular: Normal rate, regular rhythm, normal heart sounds and intact distal pulses.  No murmur heard. Pulmonary/Chest: Effort normal  and breath sounds normal. He has no wheezes.  Musculoskeletal: Normal range of motion. He exhibits no deformity.  Neurological: He is alert and oriented to person, place, and time.  Skin: Skin is warm and dry. Capillary refill takes less than 2 seconds.  Psychiatric:  Odd affect, blunted.  Inappropriately laughs and smiles with questioning.  Takes a long time to answer questions, avoids directly answering questions.  Often answers my questions with more questions.  Speech is slowed.  Avoid directly answering if he is actively suicidal, homicidal, hallucinating.  Nursing note and  vitals reviewed.    ED Treatments / Results  Labs (all labs ordered are listed, but only abnormal results are displayed) Labs Reviewed  ACETAMINOPHEN LEVEL - Abnormal; Notable for the following components:      Result Value   Acetaminophen (Tylenol), Serum <10 (*)    All other components within normal limits  CBC - Abnormal; Notable for the following components:   MCHC 36.4 (*)    All other components within normal limits  COMPREHENSIVE METABOLIC PANEL  ETHANOL  SALICYLATE LEVEL  RAPID URINE DRUG SCREEN, HOSP PERFORMED    EKG None  Radiology No results found.  Procedures Procedures (including critical care time)  Medications Ordered in ED Medications - No data to display   Initial Impression / Assessment and Plan / ED Course  I have reviewed the triage vital signs and the nursing notes.  Pertinent labs & imaging results that were available during my care of the patient were reviewed by me and considered in my medical decision making (see chart for details).    [facility]. Documented h/o psychosis, he denies this. Poor historian. Denies illicit drug or ETOH abuse. Exam is unremarkable. Pt is withdrawn, odd affect. Avoids direct questioning.  Labs reviewed and WNL. Given previous psych h/o, odd affect I am unable to get reliable history.  Feel psych team would be more appropriate to evaluate pt in ED. He is medically cleared for psychiatric evaluation. Sitter at bedside.  Discussed ED course and upcoming psych evaluation with patient who is agreeable to stay voluntarily.    Final Clinical Impressions(s) / ED Diagnoses   1630: Pt will be handed off to oncoming EDPA at shift change.  Disposition per TTS recommendations.  Final diagnoses:  Adult behavior problem    ED Discharge Orders    None       Jerrell MylarGibbons, Claudia J, PA-C 05/15/17 1629    Rolland PorterJames, Mark, MD 05/20/17 [phone]

## 2017-05-15 NOTE — ED Triage Notes (Signed)
[facility] was [facility] in by  [facility] bc he had been following people around all week at school. Patient will not answer any direct questions and usually answers your question with another question.

## 2017-05-15 NOTE — ED Notes (Signed)
Bed: WBH43 Expected date:  Expected time:  Means of arrival:  Comments: 29 

## 2017-05-15 NOTE — BH Assessment (Addendum)
Assessment Note  Jordan Gomez is an 27 y.o. male who presents voluntarily [facility]IB [facility] reporting bizarre behavior. Pt has a history of psychosis.  Pt is a poor historian and is very tangential.  Pt denies current suicidal ideation and denies having a plan. Unable to assess past attempts. Pt denies symptoms of depression and anxiety.  Pt denies homicidal ideation/ history of violence. Pt refused to answer if he is having auditory or visual hallucinations and became very agitated. Pt denies current stressors.   Unable to assess pt's living arrangements.  Pt denies having any supports. Unable to assess pt's history of abuse and trauma. Unable to assess pt's family history. Pt reports he is currently unemployed. Pt has poor insight and impaired judgment. Pt's memory is unable to assess.  Pt states he doesn't know if he has any legal history or issues.  Pt states he doesn't know if he is seeing a therapist or psychiatrist. IP history [facility].  Unable to assess pt's alcohol/ substance abuse.  Pt is dressed in scrubs with body odor, alert, oriented x4 with tangential speech and agitated motor behavior. Eye contact is fair. Pt's mood is irritable and affect is irritable. Affect is congruent with mood. Thought process is tangential and flight of ideas. There is some indication pt is currently responding to internal stimuli or experiencing delusional thought content. Pt was uncooperative throughout assessment. Pt is currently unable to contract for safety outside the hospital.   Diagnosis: F23 Brief psychotic disorder  Past Medical History: History reviewed. No pertinent past medical history.  History reviewed. No pertinent surgical history.  Family History: History reviewed. No pertinent family history.  Social History:  reports that he has quit smoking. His smoking use included cigarettes. He has never used smokeless tobacco. He reports that he drinks alcohol. He  reports that he has current or past drug history. Drug: Marijuana.  Additional Social History:  Alcohol / Drug Use Pain Medications: See MAR Prescriptions: See MAR Over the Counter: See MAR History of alcohol / drug use?: No history of alcohol / drug abuse Longest period of sobriety (when/how long): unable to assess  CIWA: CIWA-Ar BP: 112/78 Pulse Rate: 74 COWS:    Allergies: No Known Allergies  Home Medications:  (Not in a hospital admission)  OB/GYN Status:  No LMP for male patient.  General Assessment Data [facility] of Assessment: [facility] TTS Assessment: In system Is this a Tele or Face-to-Face Assessment?: Face-to-Face Is this an Initial Assessment or a Re-assessment for this encounter?: Initial Assessment Marital status: Single Maiden name: NA Is patient pregnant?: No Pregnancy Status: No Living Arrangements: (UTA) Can pt return to current living arrangement?: Yes Admission Status: Voluntary Is patient capable of signing voluntary admission?: Yes Referral Source: Self/Family/Friend Insurance type: Self pay     Crisis Care Plan Living Arrangements: (UTA) Name of Psychiatrist: UTA Name of Therapist: UTA  Education Status Is patient currently in school?: No Highest grade of school patient has completed: 12th  Is the patient employed, unemployed or receiving disability?: Unemployed  Risk to self with the past 6 months Suicidal Ideation: No Has patient been a risk to self within the past 6 months prior to admission? : No Suicidal Intent: No Has patient had any suicidal intent within the past 6 months prior to admission? : No Is patient at risk for suicide?: No Suicidal Plan?: No Has patient had any suicidal plan within the past 6 months prior to admission? :  No Access to Means: No What has been your use of drugs/alcohol within the last 12 months?: UTA Previous Attempts/Gestures: No How many times?: 0 Other Self Harm Risks: Pt denies Intentional Self  Injurious Behavior: None Family Suicide History: Unable to assess Recent stressful life event(s): Other (Comment)(Pt denies) Persecutory voices/beliefs?: No Depression: No Substance abuse history and/or treatment for substance abuse?: No Suicide prevention information given to non-admitted patients: Not applicable  Risk to Others within the past 6 months Homicidal Ideation: No Does patient have any lifetime risk of violence toward others beyond the six months prior to admission? : No Thoughts of Harm to Others: No Current Homicidal Intent: No Current Homicidal Plan: No Access to Homicidal Means: No Identified Victim: Pt denies History of harm to others?: No(UTA) Assessment of Violence: None Noted Violent Behavior Description: Pt denies Does patient have access to weapons?: No Criminal Charges Pending?: No Does patient have a court date: No Is patient on probation?: Unknown  Psychosis Hallucinations: None noted Delusions: None noted  Mental Status Report Appearance/Hygiene: Bizarre, Body odor, Disheveled, In scrubs Eye Contact: Fair Motor Activity: Restlessness Speech: Argumentative, Tangential Level of Consciousness: Alert, Irritable Mood: Irritable Affect: Irritable Anxiety Level: None Thought Processes: Tangential, Flight of Ideas Judgement: Impaired Obsessive Compulsive Thoughts/Behaviors: Unable to Assess  Cognitive Functioning Concentration: Poor Memory: Unable to Assess Is patient IDD: No Is patient DD?: No Insight: Poor Impulse Control: Poor Appetite: Fair Have you had any weight changes? : No Change Amount of the weight change? (lbs): 0 lbs Sleep: Unable to Assess Total Hours of Sleep: (UTA) Vegetative Symptoms: Not bathing, Decreased grooming, Unable to Assess  ADLScreening [facility] Assessment Services) Patient's cognitive ability adequate to safely complete daily activities?: Yes Patient able to express need for assistance with ADLs?: Yes Independently  performs ADLs?: Yes (appropriate for developmental age)  Prior Inpatient Therapy Prior Inpatient Therapy: Yes Prior Therapy Dates: 05/2017 Prior Therapy Facilty/Provider(s): [facility] Reason for Treatment: psychosis  Prior Outpatient Therapy Prior Outpatient Therapy: No Does patient have an ACCT team?: No Does patient have Intensive In-House Services?  : No Does patient have [facility] services? : No Does patient have [facility] services?: No  ADL Screening (condition at time of admission) Patient's cognitive ability adequate to safely complete daily activities?: Yes Is the patient deaf or have difficulty hearing?: No Does the patient have difficulty seeing, even when wearing glasses/contacts?: No Does the patient have difficulty concentrating, remembering, or making decisions?: No Patient able to express need for assistance with ADLs?: Yes Does the patient have difficulty dressing or bathing?: No Independently performs ADLs?: Yes (appropriate for developmental age) Does the patient have difficulty walking or climbing stairs?: No Weakness of Legs: None Weakness of Arms/Hands: None  Home Assistive Devices/Equipment Home Assistive Devices/Equipment: None    Abuse/Neglect Assessment (Assessment to be complete while patient is alone) Abuse/Neglect Assessment Can Be Completed: Unable to assess, patient is non-responsive or altered mental status     Advance Directives (For Healthcare) Does Patient Have a Medical Advance Directive?: No Would patient like information on creating a medical advance directive?: No - Patient declined    Additional Information 1:1 In Past 12 Months?: No CIRT Risk: Yes Elopement Risk: Yes Does patient have medical clearance?: No     Disposition: Gave clinical report to Nanine Means, NP who stated Pt meets criteria for inpatient psychiatric treatment.  Inetta Fermo, RN and [facility] to review, if no appropriate beds, TTS will seek placement.  Notified Morrie Sheldon, RN of  recommendation. Disposition  Initial Assessment Completed for this Encounter: Yes Disposition of Patient: Admit Type of inpatient treatment program: Adult Patient refused recommended treatment: No Mode of transportation if patient is discharged?: N/A Patient referred to: Other (Comment)(TTS to seek placement)  On Site Evaluation by:   Reviewed with Physician:    Annamaria Boots, MS, [facility] Therapeutic Triage Specialist  Annamaria Boots 05/15/2017 6:32 PM

## 2017-05-15 NOTE — ED Notes (Signed)
Bed: WA29 Expected date:  Expected time:  Means of arrival:  Comments: [employer] police psych eval

## 2017-05-16 DIAGNOSIS — F4325 Adjustment disorder with mixed disturbance of emotions and conduct: Secondary | ICD-10-CM

## 2017-05-16 DIAGNOSIS — F203 Undifferentiated schizophrenia: Secondary | ICD-10-CM | POA: Diagnosis present

## 2017-05-16 DIAGNOSIS — Z818 Family history of other mental and behavioral disorders: Secondary | ICD-10-CM

## 2017-05-16 DIAGNOSIS — F1290 Cannabis use, unspecified, uncomplicated: Secondary | ICD-10-CM

## 2017-05-16 DIAGNOSIS — R4587 Impulsiveness: Secondary | ICD-10-CM

## 2017-05-16 DIAGNOSIS — Z87891 Personal history of nicotine dependence: Secondary | ICD-10-CM

## 2017-05-16 MED ORDER — OLANZAPINE 10 MG PO TBDP: 10.0000 mg | ORAL_TABLET | Freq: Two times a day (BID) | ORAL | 0 refills | Status: DC | PRN

## 2017-05-16 NOTE — Consult Note (Addendum)
[facility] Face-to-Face Psychiatry Consult   Reason for Consult:  Bizarre behavior  Referring Physician:  EDP Patient Identification: Jordan Gomez MRN:  [id number] Principal Diagnosis: Adjustment disorder with mixed disturbance of emotions and conduct Diagnosis:   Patient Active Problem List   Diagnosis Date Noted  . Adjustment disorder with mixed disturbance of emotions and conduct [F43.25] 05/16/2017  . Acute psychosis (HCC) [F23] 05/08/2017    Total Time spent with patient: 30 minutes  Subjective:   Jordan Gomez is a 27 y.o. male patient admitted with bizarre behavior.  HPI:   Per chart review, patient was admitted with bizarre behavior. He was BIB [employer] police for disorganized behavior. He was reportedly following people around [employer] this week. He was discharged from [facility] on 4/9 for psychosis. He was discharged on Invega 6 mg daily and Trazodone 50 mg qhs PRN. He received Zyprexa 10 mg overnight. On interview, he denies SI, HI or AVH. He appropriately answers questions and does not appear to be responding to internal stimuli.   Past Psychiatric History: Psychosis   Risk to Self: Suicidal Ideation: No Suicidal Intent: No Is patient at risk for suicide?: No Suicidal Plan?: No Access to Means: No What has been your use of drugs/alcohol within the last 12 months?: UTA How many times?: 0 Other Self Harm Risks: Pt denies Intentional Self Injurious Behavior: None Risk to Others: Homicidal Ideation: No Thoughts of Harm to Others: No Current Homicidal Intent: No Current Homicidal Plan: No Access to Homicidal Means: No Identified Victim: Pt denies History of harm to others?: No(UTA) Assessment of Violence: None Noted Violent Behavior Description: Pt denies Does patient have access to weapons?: No Criminal Charges Pending?: No Does patient have a court date: No Prior Inpatient Therapy: Prior Inpatient Therapy: Yes Prior Therapy Dates: 05/2017 Prior Therapy  Facilty/Provider(s): [facility] Reason for Treatment: psychosis Prior Outpatient Therapy: Prior Outpatient Therapy: No Does patient have an ACCT team?: No Does patient have Intensive In-House Services?  : No Does patient have [facility] services? : No Does patient have P4[facility] services?: No  Past Medical History: History reviewed. No pertinent past medical history. History reviewed. No pertinent surgical history. Family History: History reviewed. No pertinent family history. Family Psychiatric  History: His mother attempted suicide.  Social History:  Social History   Substance and Sexual Activity  Alcohol Use Yes  . Frequency: Never   Comment: unable to answer     Social History   Substance and Sexual Activity  Drug Use Yes  . Types: Marijuana   Comment: every now and then, THC    Social History   Socioeconomic History  . Marital status: Single    Spouse name: Not on file  . Number of children: Not on file  . Years of education: Not on file  . Highest education level: Not on file  Occupational History  . Not on file  Social Needs  . Financial resource strain: Not on file  . Food insecurity:    Worry: Not on file    Inability: Not on file  . Transportation needs:    Medical: Not on file    Non-medical: Not on file  Tobacco Use  . Smoking status: Former Smoker    Types: Cigarettes  . Smokeless tobacco: Never Used  Substance and Sexual Activity  . Alcohol use: Yes    Frequency: Never    Comment: unable to answer  . Drug use: Yes    Types: Marijuana    Comment: every now and then,   THC  . Sexual activity: Not Currently  Lifestyle  . Physical activity:    Days per week: Not on file    Minutes per session: Not on file  . Stress: Not on file  Relationships  . Social connections:    Talks on phone: Not on file    Gets together: Not on file    Attends religious service: Not on file    Active member of club or organization: Not on file    Attends meetings of clubs or  organizations: Not on file    Relationship status: Not on file  Other Topics Concern  . Not on file  Social History Narrative  . Not on file   Additional Social History: N/A    Allergies:  No Known Allergies  Labs:  Results for orders placed or performed during the hospital encounter of 05/15/17 (from the past 48 hour(s))  Rapid urine drug screen (hospital performed)     Status: None   Collection Time: 05/15/17 12:33 PM  Result Value Ref Range   Opiates NONE DETECTED NONE DETECTED   Cocaine NONE DETECTED NONE DETECTED   Benzodiazepines NONE DETECTED NONE DETECTED   Amphetamines NONE DETECTED NONE DETECTED   Tetrahydrocannabinol NONE DETECTED NONE DETECTED   Barbiturates NONE DETECTED NONE DETECTED    Comment: (NOTE) DRUG SCREEN FOR MEDICAL PURPOSES ONLY.  IF CONFIRMATION IS NEEDED FOR ANY PURPOSE, NOTIFY LAB WITHIN 5 DAYS. LOWEST DETECTABLE LIMITS FOR URINE DRUG SCREEN Drug Class                     Cutoff (ng/mL) Amphetamine and metabolites    1000 Barbiturate and metabolites    200 Benzodiazepine                 200 Tricyclics and metabolites     300 Opiates and metabolites        300 Cocaine and metabolites        300 THC                            50 Performed at [facility], [address] [place], [place] [address]   Comprehensive metabolic panel     Status: None   Collection Time: 05/15/17 12:35 PM  Result Value Ref Range   Sodium 138 135 - 145 mmol/L   Potassium 4.1 3.5 - 5.1 mmol/L   Chloride 103 101 - 111 mmol/L   CO2 25 22 - 32 mmol/L   Glucose, Bld 83 65 - 99 mg/dL   BUN 20 6 - 20 mg/dL   Creatinine, Ser 0.84 0.61 - 1.24 mg/dL   Calcium 9.2 8.9 - 10.3 mg/dL   Total Protein 7.6 6.5 - 8.1 g/dL   Albumin 4.3 3.5 - 5.0 g/dL   AST 23 15 - 41 U/L   ALT 23 17 - 63 U/L   Alkaline Phosphatase 72 38 - 126 U/L   Total Bilirubin 0.9 0.3 - 1.2 mg/dL   GFR calc non Af Amer >60 >60 mL/min   GFR calc Af Amer >60 >60 mL/min    Comment:  (NOTE) The eGFR has been calculated using the CKD EPI equation. This calculation has not been validated in all clinical situations. eGFR's persistently <60 mL/min signify possible Chronic Kidney Disease.    Anion gap 10 5 - 15    Comment: Performed at [facility], [address] [place], [place] [address]    Ethanol     Status: None   Collection Time: 05/15/17 12:35 PM  Result Value Ref Range   Alcohol, Ethyl (B) <10 <10 mg/dL    Comment:        LOWEST DETECTABLE LIMIT FOR SERUM ALCOHOL IS 10 mg/dL FOR MEDICAL PURPOSES ONLY Performed at [facility], [place] [address] [place], [place] [address]   Salicylate level     Status: None   Collection Time: 05/15/17 12:35 PM  Result Value Ref Range   Salicylate Lvl <4.2 2.8 - 30.0 mg/dL    Comment: Performed at [facility] [place], [place] [address], [place], [place] [address]  Acetaminophen level     Status: Abnormal   Collection Time: 05/15/17 12:35 PM  Result Value Ref Range   Acetaminophen (Tylenol), Serum <10 (L) 10 - 30 ug/mL    Comment:        THERAPEUTIC CONCENTRATIONS VARY SIGNIFICANTLY. A RANGE OF 10-30 ug/mL MAY BE AN EFFECTIVE CONCENTRATION FOR MANY PATIENTS. HOWEVER, SOME ARE BEST TREATED AT CONCENTRATIONS OUTSIDE THIS RANGE. ACETAMINOPHEN CONCENTRATIONS >150 ug/mL AT 4 HOURS AFTER INGESTION AND >50 ug/mL AT 12 HOURS AFTER INGESTION ARE OFTEN ASSOCIATED WITH TOXIC REACTIONS. Performed at [facility], [place] [address], [place], [place] [address]   cbc     Status: Abnormal   Collection Time: 05/15/17 12:35 PM  Result Value Ref Range   WBC 8.5 4.0 - 10.5 K/uL   RBC 4.84 4.22 - 5.81 MIL/uL   Hemoglobin 14.7 13.0 - 17.0 g/dL   HCT 40.4 39.0 - 52.0 %   MCV 83.5 78.0 - 100.0 fL   MCH 30.4 26.0 - 34.0 pg   MCHC 36.4 (H) 30.0 - 36.0 g/dL   RDW 12.3 11.5 - 15.5 %   Platelets 363 150 - 400 K/uL    Comment: Performed at [facility], [place] [address], [place], [place] [address]    Current Facility-Administered Medications  Medication Dose Route Frequency Provider Last Rate Last Dose  . OLANZapine zydis (ZYPREXA) disintegrating tablet 10 mg  10 mg Oral BID PRN Patrecia Pour, NP   10 mg at 05/15/17 2151   Current Outpatient Medications  Medication Sig Dispense Refill  . OLANZapine zydis (ZYPREXA) 10 MG disintegrating tablet Take 1 tablet (10 mg total) by mouth 2 (two) times daily as needed (agitation). 60 tablet 0    Musculoskeletal: Strength & Muscle Tone: within normal limits Gait & Station: UTA since patient was lying in bed. Patient leans: N/A  Psychiatric Specialty Exam: Physical Exam  Nursing note and vitals reviewed. Constitutional: He is oriented to person, place, and time. He appears well-developed.  Thin  HENT:  Head: Normocephalic and atraumatic.  Neck: Normal range of motion.  Respiratory: Effort normal.  Musculoskeletal: Normal range of motion.  Neurological: He is alert and oriented to person, place, and time.  Skin: No rash noted.  Psychiatric: He has a normal mood and affect. His speech is normal and behavior is normal. Thought content normal. Cognition and memory are normal. He expresses impulsivity.    Review of Systems  Psychiatric/Behavioral: Negative for hallucinations, substance abuse and suicidal ideas.  All other systems reviewed and are negative.   Blood pressure (!) 108/47, pulse 84, temperature 97.9 F (36.6 C), temperature source Oral, resp. rate 17, height 5' 4" (1.626 m), weight 57 kg (125 lb 9.6 oz), SpO2 100 %.Body mass index is 21.56 kg/m.  General Appearance: Disheveled  Eye Contact:  Fair  Speech:  Clear and Coherent and Normal Rate  Volume:  Normal  Mood:  Euthymic  Affect:  Constricted  Thought Process:  Linear and Descriptions of Associations: Intact  Orientation:  Full (Time, Place, and Person)  Thought Content:  Logical  Suicidal Thoughts:  No  Homicidal Thoughts:   No  Memory:  Immediate;   Fair Recent;   Fair Remote;   Fair  Judgement:  Poor  Insight:  Poor  Psychomotor Activity:  Normal  Concentration:  Concentration: Fair and Attention Span: Fair  Recall:  Fair  Fund of Knowledge:  Fair  Language:  Fair  Akathisia:  No  Handed:  Right  AIMS (if indicated):   N/A  Assets:  Physical Health  ADL's:  Intact  Cognition:  WNL  Sleep:   N/A   Assessment:  Jordan Gomez is a 27 y.o. male who was admitted with bizarre behavior. On interview, he is appropriate in behavior and thought process. He denies SI, HI or AVH. He does not warrant inpatient psychiatric hospitalization at this time.   Treatment Plan Summary: -Discharge to home.  -Continue medications as prescribed.  -Follow up with outpatient provider.   Disposition: No evidence of imminent risk to self or others at present.   Patient does not meet criteria for psychiatric inpatient admission.  Jacqueline J Norman, DO 05/16/2017 11:54 AM 

## 2017-05-16 NOTE — ED Notes (Signed)
Pt d/c home per MD order. Discharge summary reviewed with pt, pt verbalizes understanding. RX provided. Pt denies SI/HI/AVH. Pt signed for personal property and property returned. Pt signed e-signature. Ambulatory off unit with [facility].

## 2017-05-16 NOTE — BH Assessment (Signed)
[facility] Assessment Progress Note  Per Jacqueline Norman, DO, this pt does not require psychiatric hospitalization at this time.  Pt is to be discharged from [facility] with recommendation to follow up with [facility].  This has been included in pt's discharge instructions.  Pt's nurse, Diane, has been notified.  Idara Woodside, MA Triage Specialist [phone]     

## 2017-05-16 NOTE — Discharge Instructions (Signed)
For your mental health needs, you are advised to follow up with [facility].  New and returning patients are seen at their walk-in clinic.  Walk-in hours are Monday - Friday from 8:00 am - 3:00 pm.  Walk-in patients are seen on a first come, first served basis.  Try to arrive as early as possible for he best chance of being seen the same day: ° °     [facility] °     [address] °     [place], [place] [address] °     [phone] °

## 2017-05-19 ENCOUNTER — Other Ambulatory Visit: Payer: Self-pay

## 2017-05-19 ENCOUNTER — Encounter (HOSPITAL_COMMUNITY): Payer: Self-pay

## 2017-05-19 ENCOUNTER — Emergency Department (HOSPITAL_COMMUNITY)
Admission: EM | Admit: 2017-05-19 | Discharge: 2017-05-19 | Disposition: A | Discharge status: Home / Self Care | Payer: Self-pay | Attending: Emergency Medicine | Admitting: Emergency Medicine

## 2017-05-19 DIAGNOSIS — Z0489 Encounter for examination and observation for other specified reasons: Secondary | ICD-10-CM | POA: Insufficient documentation

## 2017-05-19 DIAGNOSIS — Z5321 Procedure and treatment not carried out due to patient leaving prior to being seen by health care provider: Secondary | ICD-10-CM | POA: Insufficient documentation

## 2017-05-19 NOTE — ED Notes (Signed)
Pt did not want his labs drawn.  Pt stated that he needed his blood to live.  Informed RN.

## 2017-05-19 NOTE — ED Triage Notes (Addendum)
Patient denies any SI/HI, visual or auditory hallucinations, alcohol. Patient does admit to smoking marijuana last night.Patient states he just wants "to find out what is wrong with me." patient does not answer questions directly and answers are bizarre at times like "I hope people do not pronounce suicide wrong because it would sound really weird." patient looked toward the ceiling and stated, "not the blood work. We need that."

## 2017-05-19 NOTE — ED Notes (Signed)
Pt told to wait in lobby until we get a room because pt is refusing blood work, pt sts" I will go somewhere else because I dont feel like I need to give my blood".

## 2017-05-24 ENCOUNTER — Emergency Department (HOSPITAL_COMMUNITY)
Admission: EM | Admit: 2017-05-24 | Discharge: 2017-05-25 | Disposition: A | Discharge status: Home / Self Care | Payer: Self-pay | Attending: Emergency Medicine | Admitting: Emergency Medicine

## 2017-05-24 ENCOUNTER — Encounter (HOSPITAL_COMMUNITY): Payer: Self-pay | Admitting: Emergency Medicine

## 2017-05-24 ENCOUNTER — Other Ambulatory Visit: Payer: Self-pay

## 2017-05-24 DIAGNOSIS — F209 Schizophrenia, unspecified: Secondary | ICD-10-CM | POA: Insufficient documentation

## 2017-05-24 DIAGNOSIS — F4325 Adjustment disorder with mixed disturbance of emotions and conduct: Secondary | ICD-10-CM | POA: Insufficient documentation

## 2017-05-24 DIAGNOSIS — Z87891 Personal history of nicotine dependence: Secondary | ICD-10-CM | POA: Insufficient documentation

## 2017-05-24 DIAGNOSIS — F203 Undifferentiated schizophrenia: Secondary | ICD-10-CM | POA: Diagnosis present

## 2017-05-24 LAB — RAPID URINE DRUG SCREEN, HOSP PERFORMED
Amphetamines: NOT DETECTED
BARBITURATES: NOT DETECTED
Benzodiazepines: NOT DETECTED
Cocaine: NOT DETECTED
Opiates: NOT DETECTED
Tetrahydrocannabinol: NOT DETECTED

## 2017-05-24 NOTE — ED Notes (Signed)
This Clinical research associatewriter finished drawing pt's lab work Pt then asked for cup of water Pt stated "I probably need to eat. I think that's what wrong with me." Pt then hung his head low, began to shake, and slid out of chair. Pt was not responsive for about 30 seconds Pt woke up and asked what happened.  Pt was assisted to a seated position on the floor and experienced another episode like previously stated  Pt was helped to stand and placed back into chair. Bed was brought from the back and pt was taken to an acute rm

## 2017-05-24 NOTE — ED Triage Notes (Signed)
Pt presents voluntarily by [employer] for wanting help in getting seen for mental health problems. Pt currently denies any SI/HI. Pt states "I want to find out what is wrong with me before other people do."

## 2017-05-24 NOTE — ED Notes (Signed)
Pt denies chest pain or SOB. EKG performed after syncopal episode in triage and given to EDP.

## 2017-05-25 LAB — CBC WITH DIFFERENTIAL/PLATELET
BASOS PCT: 0 %
Basophils Absolute: 0 10*3/uL (ref 0.0–0.1)
EOS ABS: 0.1 10*3/uL (ref 0.0–0.7)
Eosinophils Relative: 1 %
HCT: 40.5 % (ref 39.0–52.0)
HEMOGLOBIN: 14.4 g/dL (ref 13.0–17.0)
Lymphocytes Relative: 27 %
Lymphs Abs: 2.5 10*3/uL (ref 0.7–4.0)
MCH: 29.8 pg (ref 26.0–34.0)
MCHC: 35.6 g/dL (ref 30.0–36.0)
MCV: 83.9 fL (ref 78.0–100.0)
MONOS PCT: 7 %
Monocytes Absolute: 0.6 10*3/uL (ref 0.1–1.0)
NEUTROS PCT: 65 %
Neutro Abs: 5.8 10*3/uL (ref 1.7–7.7)
PLATELETS: 307 10*3/uL (ref 150–400)
RBC: 4.83 MIL/uL (ref 4.22–5.81)
RDW: 12.5 % (ref 11.5–15.5)
WBC: 9.1 10*3/uL (ref 4.0–10.5)

## 2017-05-25 LAB — COMPREHENSIVE METABOLIC PANEL
ALK PHOS: 83 U/L (ref 38–126)
ALT: 28 U/L (ref 17–63)
ANION GAP: 12 (ref 5–15)
AST: 32 U/L (ref 15–41)
Albumin: 4 g/dL (ref 3.5–5.0)
BILIRUBIN TOTAL: 0.7 mg/dL (ref 0.3–1.2)
BUN: 22 mg/dL — ABNORMAL HIGH (ref 6–20)
CALCIUM: 8.9 mg/dL (ref 8.9–10.3)
CO2: 22 mmol/L (ref 22–32)
Chloride: 104 mmol/L (ref 101–111)
Creatinine, Ser: 0.94 mg/dL (ref 0.61–1.24)
GFR calc non Af Amer: >60 mL/min (ref >60)
GLUCOSE: 95 mg/dL (ref 65–99)
Potassium: 3.8 mmol/L (ref 3.5–5.1)
Sodium: 138 mmol/L (ref 135–145)
TOTAL PROTEIN: 7.3 g/dL (ref 6.5–8.1)

## 2017-05-25 LAB — ETHANOL: Alcohol, Ethyl (B): <10 mg/dL (ref <10)

## 2017-05-25 MED ORDER — HALOPERIDOL 0.5 MG PO TABS
2.0000 mg | ORAL_TABLET | Freq: Two times a day (BID) | ORAL | Status: DC
  Filled 2017-05-25: qty 4

## 2017-05-25 MED ORDER — OLANZAPINE 10 MG PO TBDP: 10.0000 mg | ORAL_TABLET | Freq: Two times a day (BID) | ORAL | Status: DC | PRN

## 2017-05-25 MED ORDER — HALOPERIDOL 2 MG PO TABS: 2.0000 mg | ORAL_TABLET | Freq: Two times a day (BID) | ORAL | 0 refills | Status: DC

## 2017-05-25 NOTE — ED Notes (Signed)
Pt decided to speak with TTS.

## 2017-05-25 NOTE — [facility] Suicide Risk Assessment (Signed)
Suicide Risk Assessment  Discharge Assessment   [facility]   Principal Problem: Adjustment disorder with mixed disturbance of emotions and conduct Discharge Diagnoses:  Patient Active Problem List   Diagnosis Date Noted  . Adjustment disorder with mixed disturbance of emotions and conduct [F43.25] 05/16/2017    Priority: High  . Acute psychosis (HCC) [F23] 05/08/2017    Total Time spent with patient: 45 minutes  Musculoskeletal: Strength & Muscle Tone: within normal limits Gait & Station: normal Patient leans: N/A  Psychiatric Specialty Exam:   Blood pressure (!) 95/54, pulse 90, temperature 98.3 F (36.8 C), temperature source Oral, resp. rate 16, height 5\' 4"  (1.626 m), weight 59 kg (130 lb), SpO2 100 %.Body mass index is 22.31 kg/m.  General Appearance: Casual  Eye Contact::  Good  Speech:  Normal Rate409  Volume:  Normal  Mood:  Euthymic  Affect:  Congruent  Thought Process:  Coherent and Descriptions of Associations: Intact  Orientation:  Full (Time, Place, and Person)  Thought Content:  WDL and Logical  Suicidal Thoughts:  No  Homicidal Thoughts:  No  Memory:  Immediate;   Good Recent;   Good Remote;   Good  Judgement:  Fair  Insight:  Fair  Psychomotor Activity:  Normal  Concentration:  Good  Recall:  Good  Fund of Knowledge:Fair  Language: Good  Akathisia:  No  Handed:  Right  AIMS (if indicated):     Assets:  Leisure Time Physical Health Resilience  Sleep:     Cognition: WNL  ADL's:  Intact   Mental Status Per Nursing Assessment::   On Admission:   27 yo male who presented to the ED who was here for a mental evaluation.  No suicidal/homicidal ideations, hallucinations, or substance abuse issues.  He is not responding to internal stimuli.  [facility] but did not take his medications.  Started Haldol since Hinda Glatternvega is so expensive and [facility].  Demographic Factors:  Male  and Adolescent or young adult  Loss Factors: NA  Historical Factors: NA  Risk Reduction Factors:   Positive social support  Continued Clinical Symptoms:  None   Cognitive Features That Contribute To Risk:  None    Suicide Risk:  Minimal: No identifiable suicidal ideation.  Patients presenting with no risk factors but with morbid ruminations; may be classified as minimal risk based on the severity of the depressive symptoms    Plan Of Care/Follow-up recommendations:  Activity:  as tolerated Diet:  as tolerated  Demichael Traum, NP 05/25/2017, 9:52 AM

## 2017-05-25 NOTE — ED Notes (Signed)
Patient arrived to unit and is pleasant and with bright affect on approach. Pt denies suicidal or homicidal ideation. Pt provided with sandwich and soda upon request. Pt states "I just need to see a psych doc to be checked out". No distress noted at this time. Pt appears to be drowsy, with his eyes closing while talking. Respirations regular and unlabored, skin warm and dry.

## 2017-05-25 NOTE — ED Provider Notes (Signed)
TIME SEEN: 12:18 AM  CHIEF COMPLAINT: Medical clearance  HPI: Patient is a 27 year old male with history of psychosis who presents to the emergency department for medical clearance.  Is very difficult to determine why patient is here in the emergency department.  He states to me "I just want make sure there is nothing wrong with me".  He is unable to tell me initially if he is having symptoms.  When I asked if he is suicidal he states "can't you ask me a relevant question?"  He then later denies suicidality or homicidality.  When asked if he is having hallucinations he states "I do not know. Are you real?  Have you checked?"  He denies any medical complaints at this time.  Denies fevers, chills, cough, vomiting, diarrhea, chest pain or shortness of breath.  Patient did have a syncopal event out front when having his blood drawn.  Likely vasovagal in nature.  When asked if he feels he needs to see the psychiatrist he states "I do not know".  ROS: See HPI Constitutional: no fever  Eyes: no drainage  ENT: no runny nose   Cardiovascular:  no chest pain  Resp: no SOB  GI: no vomiting GU: no dysuria Integumentary: no rash  Allergy: no hives  Musculoskeletal: no leg swelling  Neurological: no slurred speech ROS otherwise negative  PAST MEDICAL HISTORY/PAST SURGICAL HISTORY:  History reviewed. No pertinent past medical history.  MEDICATIONS:  Prior to Admission medications   Medication Sig Start Date End Date Taking? Authorizing Provider  OLANZapine zydis (ZYPREXA) 10 MG disintegrating tablet Take 1 tablet (10 mg total) by mouth 2 (two) times daily as needed (agitation). 05/16/17  Yes Charm Rings, NP    ALLERGIES:  No Known Allergies  SOCIAL HISTORY:  Social History   Tobacco Use  . Smoking status: Former Smoker    Types: Cigarettes  . Smokeless tobacco: Never Used  Substance Use Topics  . Alcohol use: Yes    Frequency: Never    Comment: seldom    FAMILY HISTORY: Family  History  Family history unknown: Yes    EXAM: BP 102/71 (BP Location: Left Arm)   Pulse 60   Temp 98.4 F (36.9 C) (Oral)   Resp 17   Ht 5\' 4"  (1.626 m)   Wt 59 kg (130 lb)   SpO2 100%   BMI 22.31 kg/m  CONSTITUTIONAL: Alert and oriented and responds appropriately to questions. Well-appearing; well-nourished HEAD: Normocephalic EYES: Conjunctivae clear, pupils appear equal, EOMI ENT: normal nose; moist mucous membranes NECK: Supple, no meningismus, no nuchal rigidity, no LAD  CARD: RRR; S1 and S2 appreciated; no murmurs, no clicks, no rubs, no gallops RESP: Normal chest excursion without splinting or tachypnea; breath sounds clear and equal bilaterally; no wheezes, no rhonchi, no rales, no hypoxia or respiratory distress, speaking full sentences ABD/GI: Normal bowel sounds; non-distended; soft, non-tender, no rebound, no guarding, no peritoneal signs, no hepatosplenomegaly BACK:  The back appears normal and is non-tender to palpation, there is no CVA tenderness EXT: Normal ROM in all joints; non-tender to palpation; no edema; normal capillary refill; no cyanosis, no calf tenderness or swelling    SKIN: Normal color for age and race; warm; no rash NEURO: Moves all extremities equally PSYCH: Extremely bizarre behavior.  Tangential thought process.  Poor insight.  Denies SI or HI.  Unable to assess if patient is having hallucinations.  MEDICAL DECISION MAKING: Patient here with possible psychosis.  I feel he needs a TTS  evaluation.  It looks like he was here recently and they recommended inpatient treatment but then retracted this later.  He did have a syncopal event with having his blood drawn.  I suspect this was vasovagal in nature.  EKG shows early repolarization but no ischemic abnormality, interval abnormality, arrhythmia.  Screening labs and urine unremarkable.  He is here at this time voluntarily.  ED PROGRESS: TCS recommends patient be observed overnight and reassessed in the  morning.  I feel this is appropriate.   I reviewed all nursing notes, vitals, pertinent previous records, EKGs, lab and urine results, imaging (as available).    EKG Interpretation  Date/Time:  Saturday May 24 2017 23:50:08 EDT Ventricular Rate:  59 PR Interval:    QRS Duration: 99 QT Interval:  418 QTC Calculation: 415 R Axis:   91 Text Interpretation:  Sinus rhythm Probable left atrial enlargement RSR' in V1 or V2, probably normal variant Early repolarization No old tracing to compare Confirmed by Ward, Baxter HireKristen [phone]([id number]) on 05/24/2017 11:53:39 PM         Ward, Layla MawKristen N, DO 05/25/17 [id number]

## 2017-05-25 NOTE — ED Notes (Signed)
TTS called to set up telepsych machine. Pt refuses to speak to a machine. He states, "I don't want to drive good help crazy." He could not sensibly elaborate.

## 2017-05-25 NOTE — ED Notes (Signed)
Per TTS pt is going to be observed overnight and reassessed in the morning

## 2017-05-25 NOTE — ED Notes (Signed)
TTS AT BEDSIDE 

## 2017-05-25 NOTE — BH Assessment (Signed)
Tele Assessment Note   Patient Name: Jordan Gomez MRN: [id number] Referring Physician: Rochele RaringKristen Ward, DO [facility] Location of Provider: Behavioral Health TTS Department  Yesenia Elisabeth MostStevenson is an 27 y.o. male who [facility] [employer] due to concerns that something was "wrong" with him. Pt expressed concerns that he has thought there might be something wrong and he doesn't want others to know something is wrong and not tell him, so he decided to come to the hospital in an effort to determine if there is something wrong in an effort to find out before others do.  Pt is unable to answer most questions bluntly. Pt answers "I don't think so" regarding SI, states "I hope not" regarding HI, and shares he believes "it's probably natural" regarding AVH. When clinician inquired about NSSIB, pt states he's "not for certain that [he has]" and "I don't know" regarding purposely harming himself. When questioned about past suicide attempts, pt states, "I honestly don't think I have [attempted suicide]."  Pt acknowledges he uses marijuana and alcohol. He shares that he uses both of these infrequently, though past reports note that his use was quite frequent and much greater than what he reported tonight. Pt denies any pending criminal charges, any upcoming court dates, or that he is on probation.  Pt declines that he is currently taking any medication. He states, "I don't think I've needed medicines." When clinician inquired as to whether he's been prescribed medications in the past (as pt's prior discharge paperwork states he was prescribed medication), pt states that he has not agreed with the medication he's been prescribed and believes he would benefit from other medications in it's place.  Pt was oriented x4. His recent and remote memory is not intact. Pt was overall cooperative throughout the assessment, although he required re-direction at times and  attempted to argue what was asked or the interpretation of what he said (as he carried on for many of the questions posed). Pt's insight, judgement, and impulse control is poor at this time.   Diagnosis: F20.9, Schizophrenia  Past Medical History: History reviewed. No pertinent past medical history.  History reviewed. No pertinent surgical history.  Family History:  Family History  Family history unknown: Yes    Social History:  reports that he has quit smoking. His smoking use included cigarettes. He has never used smokeless tobacco. He reports that he drinks alcohol. He reports that he has current or past drug history. Drug: Marijuana.  Additional Social History:  Alcohol / Drug Use Pain Medications: Please see MAR Prescriptions: Please see MAR Over the Counter: Please see MAR History of alcohol / drug use?: Yes Longest period of sobriety (when/how long): Unknown Substance #1 Name of Substance 1: Marijuana 1 - Age of First Use: Unknown 1 - Amount (size/oz): Unknown 1 - Frequency: Pt states he uses infrequently 1 - Duration: Unknown 1 - Last Use / Amount: 1 week ago Substance #2 Name of Substance 2: EtOH 2 - Age of First Use: Unknown 2 - Amount (size/oz): 2 drinks 2 - Frequency: Pt states he uses infrequently 2 - Duration: Unknown 2 - Last Use / Amount: 05/24/17  CIWA: CIWA-Ar BP: 104/69 Pulse Rate: 62 COWS:    Allergies: No Known Allergies  Home Medications:  (Not in a hospital admission)  OB/GYN Status:  No LMP for male patient.  General Assessment Data Location of Assessment: [facility] ED TTS Assessment: In system Is this a Tele or  Face-to-Face Assessment?: Tele Assessment Is this an Initial Assessment or a Re-assessment for this encounter?: Initial Assessment Marital status: Single Maiden name: Saetern Is patient pregnant?: No Pregnancy Status: No Living Arrangements: Alone Can pt return to current living arrangement?: Yes Admission Status: Voluntary Is  patient capable of signing voluntary admission?: Yes Referral Source: Self/Family/Friend Insurance type: None  Medical Screening Exam [facility] Walk-in ONLY) Medical Exam completed: Yes  Crisis Care Plan Living Arrangements: Alone Legal Guardian: Other:(N/A) Name of Psychiatrist: Unknown; pt was at [facility] 3 weeks ago Name of Therapist: Unknown; pt was at [facility] 3 weeks ago  Education Status Is patient currently in school?: No Highest grade of school patient has completed: 12th Retail [employer]) Is the patient employed, unemployed or receiving disability?: Unemployed  Risk to self with the past 6 months Suicidal Ideation: No(Pt states "I don't think so") Has patient been a risk to self within the past 6 months prior to admission? : No(Pt states "I don't think so") Suicidal Intent: No Has patient had any suicidal intent within the past 6 months prior to admission? : No Is patient at risk for suicide?: No Suicidal Plan?: No Has patient had any suicidal plan within the past 6 months prior to admission? : No Access to Means: No What has been your use of drugs/alcohol within the last 12 months?: Pt admits he has been using alcohol and marijuana Previous Attempts/Gestures: (Pt states "I honestly don't think I have") Other Self Harm Risks: Pt appears to be unable to think clearly & has flight of ideas Triggers for Past Attempts: None known Intentional Self Injurious Behavior: None(Pt states, "I'm not for certain I have. I don't know.") Family Suicide History: Unable to assess Recent stressful life event(s): Other (Comment)(Pt is currently homeless) Persecutory voices/beliefs?: No Depression: No Substance abuse history and/or treatment for substance abuse?: No Suicide prevention information given to non-admitted patients: Not applicable  Risk to Others within the past 6 months Homicidal Ideation: No(Pt states "I hope not") Does patient have any lifetime risk of violence  toward others beyond the six months prior to admission? : No Thoughts of Harm to Others: No Current Homicidal Intent: No Current Homicidal Plan: No Access to Homicidal Means: No Identified Victim: N/A History of harm to others?: No(Pt states "I hope not") Assessment of Violence: On admission Violent Behavior Description: None noted Does patient have access to weapons?: No Criminal Charges Pending?: No Does patient have a court date: No Is patient on probation?: No  Psychosis Hallucinations: (Pt states "It's probably natural") Delusions: None noted  Mental Status Report Appearance/Hygiene: Bizarre, In scrubs Eye Contact: Fair Motor Activity: Shuffling, Restlessness Speech: Tangential Level of Consciousness: Alert Mood: Anxious, Apprehensive Affect: Apprehensive, Appropriate to circumstance Anxiety Level: Minimal Thought Processes: Tangential, Flight of Ideas Judgement: Impaired Orientation: Person, Place, Time, Situation Obsessive Compulsive Thoughts/Behaviors: None  Cognitive Functioning Concentration: Decreased Memory: Recent Impaired, Remote Impaired Is patient IDD: (Unknown; unable to determine due to pt's flight of ideas) Is patient DD?: No Insight: Poor Impulse Control: Poor Appetite: Good Have you had any weight changes? : No Change Amount of the weight change? (lbs): 0 lbs Sleep: No Change Total Hours of Sleep: 7 Vegetative Symptoms: None  ADLScreening [facility] Assessment Services) Patient's cognitive ability adequate to safely complete daily activities?: Yes Patient able to express need for assistance with ADLs?: Yes Independently performs ADLs?: Yes (appropriate for developmental age)  Prior Inpatient Therapy Prior Inpatient Therapy: Yes Prior Therapy Dates: 05/2017 Prior Therapy  Facilty/Provider(s): [facility] Reason for Treatment: Psychosis  Prior Outpatient Therapy Prior Outpatient Therapy: No Does patient have an ACCT team?: No Does patient have  Intensive In-House Services?  : No Does patient have [facility] services? : No Does patient have P4CC services?: No  ADL Screening (condition at time of admission) Patient's cognitive ability adequate to safely complete daily activities?: Yes Is the patient deaf or have difficulty hearing?: No Does the patient have difficulty seeing, even when wearing glasses/contacts?: No Does the patient have difficulty concentrating, remembering, or making decisions?: Yes Patient able to express need for assistance with ADLs?: Yes Does the patient have difficulty dressing or bathing?: No Independently performs ADLs?: Yes (appropriate for developmental age) Does the patient have difficulty walking or climbing stairs?: No       Abuse/Neglect Assessment (Assessment to be complete while patient is alone) Abuse/Neglect Assessment Can Be Completed: Unable to assess, patient is non-responsive or altered mental status(Pt refused to answer this question; did not even want clinician to put "pt declined to answer this question" as an answer to this question, stating it would be providing an answer) Values / Beliefs Cultural Requests During Hospitalization: None Spiritual Requests During Hospitalization: None Consults Spiritual Care Consult Needed: No Social Work Consult Needed: No Merchant navy officer (For Healthcare) Does Patient Have a Medical Advance Directive?: No Would patient like information on creating a medical advance directive?: No - Patient declined          Disposition: Nira Conn NP reviewed pt's chart and information and determined that pt should be observed overnight for safety and security and re-evaluated in the morning.   Disposition Initial Assessment Completed for this Encounter: Yes Patient referred to: Other (Comment)(Jason Allyson Sabal NP determined pt should be observed overnight)  This service was provided via telemedicine using a 2-way, interactive audio and video technology.  Names  of all persons participating in this telemedicine service and their role in this encounter. Name: Jordan Gomez Role: Patient    Ralph Dowdy 05/25/2017 1:32 AM

## 2017-05-25 NOTE — ED Notes (Signed)
Patient awake and is noted to talk to himself and appears to be responding to internal stimuli. Pt does, however, remain cooperative at this time.

## 2017-05-25 NOTE — BH Assessment (Signed)
BHH Assessment Progress Note     Per Dr. Akintayo, patient has been psychiatrically cleared for discharge with referral to [facility] for outpatient services.  

## 2017-05-25 NOTE — ED Notes (Signed)
RX X 1 GIVEN 

## 2017-05-28 ENCOUNTER — Emergency Department (HOSPITAL_COMMUNITY)
Admission: EM | Admit: 2017-05-28 | Discharge: 2017-05-31 | Disposition: A | Discharge status: Other Acute Inpatient Hospital | Payer: Self-pay | Attending: Emergency Medicine | Admitting: Emergency Medicine

## 2017-05-28 ENCOUNTER — Encounter (HOSPITAL_COMMUNITY): Payer: Self-pay | Admitting: *Deleted

## 2017-05-28 DIAGNOSIS — F23 Brief psychotic disorder: Secondary | ICD-10-CM | POA: Diagnosis present

## 2017-05-28 DIAGNOSIS — Z781 Physical restraint status: Secondary | ICD-10-CM | POA: Insufficient documentation

## 2017-05-28 DIAGNOSIS — Z87891 Personal history of nicotine dependence: Secondary | ICD-10-CM | POA: Insufficient documentation

## 2017-05-28 DIAGNOSIS — Z046 Encounter for general psychiatric examination, requested by authority: Secondary | ICD-10-CM

## 2017-05-28 DIAGNOSIS — F10920 Alcohol use, unspecified with intoxication, uncomplicated: Secondary | ICD-10-CM | POA: Insufficient documentation

## 2017-05-28 DIAGNOSIS — F209 Schizophrenia, unspecified: Secondary | ICD-10-CM | POA: Insufficient documentation

## 2017-05-28 LAB — CBC
HCT: 37.8 % — ABNORMAL LOW (ref 39.0–52.0)
Hemoglobin: 13.2 g/dL (ref 13.0–17.0)
MCH: 29.3 pg (ref 26.0–34.0)
MCHC: 34.9 g/dL (ref 30.0–36.0)
MCV: 84 fL (ref 78.0–100.0)
PLATELETS: 267 10*3/uL (ref 150–400)
RBC: 4.5 MIL/uL (ref 4.22–5.81)
RDW: 12.5 % (ref 11.5–15.5)
WBC: 6.5 10*3/uL (ref 4.0–10.5)

## 2017-05-28 MED ORDER — STERILE WATER FOR INJECTION IJ SOLN
INTRAMUSCULAR | Status: AC
  Administered 2017-05-28: 1.2 mL
  Filled 2017-05-28: qty 10

## 2017-05-28 MED ORDER — ZIPRASIDONE MESYLATE 20 MG IM SOLR
20.0000 mg | Freq: Once | INTRAMUSCULAR | Status: AC
  Administered 2017-05-28: 20 mg via INTRAMUSCULAR
  Filled 2017-05-28: qty 20

## 2017-05-28 NOTE — ED Notes (Signed)
Initial Soft wrist restraint initiated.  Pt asleep at the time.  Paper scrubs (Bottoms) applied.  Bi lateral pulses intact after handcuff removal.  No redness or swelling and no broken skin.  Pt maintains full ROM prior to application of soft wrist restraints.
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
## 2017-05-28 NOTE — ED Notes (Signed)
Pt in room with hands cuffed to bed rails bi laterally.  Pt is screaming, making threats, thrashing around and resisting care.  4 [employer] officers restrain patient to bed.  Pt given 20mg  geodon .  Pt asleep

## 2017-05-28 NOTE — ED Notes (Signed)
Bed: WA26 Expected date:  Expected time:  Means of arrival:  Comments: 

## 2017-05-29 LAB — COMPREHENSIVE METABOLIC PANEL
ALBUMIN: 4 g/dL (ref 3.5–5.0)
ALK PHOS: 71 U/L (ref 38–126)
ALT: 27 U/L (ref 17–63)
AST: 34 U/L (ref 15–41)
Anion gap: 11 (ref 5–15)
BUN: 19 mg/dL (ref 6–20)
CHLORIDE: 105 mmol/L (ref 101–111)
CO2: 21 mmol/L — AB (ref 22–32)
CREATININE: 0.85 mg/dL (ref 0.61–1.24)
Calcium: 8.8 mg/dL — ABNORMAL LOW (ref 8.9–10.3)
GFR calc Af Amer: >60 mL/min (ref >60)
GFR calc non Af Amer: >60 mL/min (ref >60)
GLUCOSE: 80 mg/dL (ref 65–99)
Potassium: 3.5 mmol/L (ref 3.5–5.1)
SODIUM: 137 mmol/L (ref 135–145)
Total Bilirubin: 0.8 mg/dL (ref 0.3–1.2)
Total Protein: 6.8 g/dL (ref 6.5–8.1)

## 2017-05-29 LAB — ETHANOL: ALCOHOL ETHYL (B): 153 mg/dL — AB (ref <10)

## 2017-05-29 LAB — ACETAMINOPHEN LEVEL

## 2017-05-29 LAB — SALICYLATE LEVEL

## 2017-05-29 MED ORDER — TRAZODONE HCL 50 MG PO TABS
50.0000 mg | ORAL_TABLET | Freq: Every day | ORAL | Status: DC
  Administered 2017-05-29: 50 mg via ORAL
  Filled 2017-05-29: qty 1

## 2017-05-29 MED ORDER — HYDROXYZINE HCL 25 MG PO TABS
50.0000 mg | ORAL_TABLET | Freq: Three times a day (TID) | ORAL | Status: DC
  Administered 2017-05-29 – 2017-05-30 (×5): 50 mg via ORAL
  Filled 2017-05-29 (×5): qty 2

## 2017-05-29 MED ORDER — PALIPERIDONE ER 6 MG PO TB24
6.0000 mg | ORAL_TABLET | Freq: Every day | ORAL | Status: DC
  Administered 2017-05-29 – 2017-05-30 (×2): 6 mg via ORAL
  Filled 2017-05-29 (×2): qty 1

## 2017-05-29 NOTE — Progress Notes (Signed)
Pt awake and requesting toileting.  Pt is calm and cooperative.  Pt thanks this Clinical research associatewriter for being nice to him and giving him an injection.  Pt sts if we remove restraints he will behave and follow rules.  Pt asks for drink.  Pt gets dressed in scrub top. Pt given OJ to drink.  Pt lays back down and goes back to sleep. Pt remains safe on unit.

## 2017-05-29 NOTE — BH Assessment (Signed)
[facility]  Per Juanetta BeetsJacqueline Norman, DO, this [facility] and stabilization.  Pt presents under IVC initiated by law enforcement, which Dr Sharma CovertNorman has upheld.  Pt's nurse, Donnal DebarRandi, has been notified.  Doylene Canninghomas Clester Chlebowski, [place] Behavioral Health Coordinator [phone]

## 2017-05-29 NOTE — BHH Counselor (Signed)
TTS writer attempted to assess patient. Patient continues to be sleep unable to awake.

## 2017-05-29 NOTE — ED Notes (Signed)
Bed: WBH42 Expected date:  Expected time:  Means of arrival:  Comments: 26 

## 2017-05-29 NOTE — BH Assessment (Signed)
Tele Assessment Note   Patient Name: Jordan Gomez MRN: [id number] Referring Physician:  Location of Patient:  [facility] Location of Provider: Behavioral Health TTS Department  Rainier Tuckey is an 27 y.o. male with a history of adjustment disorder and acute psychosis presents to the emergency department with [employer] after he was found running into traffic attempting to get hit by a car per ED note. Patient report he does not know why he was brought to the ED. Patient discussed getting into a verbal altercation with another person due to thinking the person was talking about him. Patient stated the cops picked up and brought him to the ED. Patient denies suicidal/homoicidal ideation, auditory/visual hallucinations. Patient a poor historian, when TTS writer asked patient some questions he responded,'that's an interesting question' however he never answered the question. TTS writer unable to information pertaining to patient substance usage, criminal record, explanation for admission to the ER, and      Diagnosis:F20.9, Schizophrenia  Past Medical History: History reviewed. No pertinent past medical history.  History reviewed. No pertinent surgical history.  Family History:  Family History  Family history unknown: Yes    Social History:  reports that he has quit smoking. His smoking use included cigarettes. He has never used smokeless tobacco. He reports that he drinks alcohol. He reports that he has current or past drug history. Drug: Marijuana.  Additional Social History:  Alcohol / Drug Use Pain Medications: Please see MAR Prescriptions: Please see MAR Over the Counter: Please see MAR History of alcohol / drug use?: Yes Longest period of sobriety (when/how long): Unknown Substance #1 Name of Substance 1: Marijuana 1 - Age of First Use: Unknown 1 - Amount (size/oz): Unknown 1 - Frequency: Pt states he uses infrequently 1 - Duration: Unknown 1 - Last Use / Amount:  unknown Substance #2 Name of Substance 2: EtOH 2 - Age of First Use: Unknown 2 - Amount (size/oz): 2 drinks 2 - Frequency: Pt states he uses infrequently 2 - Duration: Unknown 2 - Last Use / Amount: 05/24/17  CIWA: CIWA-Ar BP: 117/74 Pulse Rate: 74 COWS:    Allergies: No Known Allergies  Home Medications:  (Not in a hospital admission)  OB/GYN Status:  No LMP for male patient.  General Assessment Data Location of Assessment: [facility] TTS Assessment: In system Is this a Tele or Face-to-Face Assessment?: Face-to-Face Is this an Initial Assessment or a Re-assessment for this encounter?: Initial Assessment Marital status: Single Maiden name: n/a Is patient pregnant?: No Pregnancy Status: No Living Arrangements: Alone Can pt return to current living arrangement?: Yes Admission Status: Voluntary Is patient capable of signing voluntary admission?: Yes Referral Source: Self/Family/Friend Insurance type: None     Crisis Care Plan Living Arrangements: Alone Legal Guardian: Other:(self) Name of Psychiatrist: Unknown; pt was at [facility] 3 weeks ago Name of Therapist: Unknown; pt was at [facility] 3 weeks ago  Education Status Is patient currently in school?: No Highest grade of school patient has completed: 12th Retail buyer) Is the patient employed, unemployed or receiving disability?: Unemployed  Risk to self with the past 6 months Suicidal Ideation: No Has patient been a risk to self within the past 6 months prior to admission? : No Suicidal Intent: No Has patient had any suicidal intent within the past 6 months prior to admission? : No Is patient at risk for suicide?: No Suicidal Plan?: No Has patient had any suicidal plan within the past 6 months prior to admission? :  No Access to Means: No What has been your use of drugs/alcohol within the last 12 months?: pt denies Previous Attempts/Gestures: No Other Self Harm Risks: pt denies Triggers for Past  Attempts: None known Intentional Self Injurious Behavior: None Family Suicide History: Unable to assess Recent stressful life event(s): Other (Comment)(pt report he's homeless) Persecutory voices/beliefs?: No Depression: No Substance abuse history and/or treatment for substance abuse?: Yes Suicide prevention information given to non-admitted patients: Not applicable  Risk to Others within the past 6 months Homicidal Ideation: No Does patient have any lifetime risk of violence toward others beyond the six months prior to admission? : No Thoughts of Harm to Others: No Current Homicidal Intent: No Current Homicidal Plan: No Access to Homicidal Means: No Identified Victim: N/A History of harm to others?: No Assessment of Violence: On admission Violent Behavior Description: None noted Does patient have access to weapons?: No Criminal Charges Pending?: No Does patient have a court date: No Is patient on probation?: No  Psychosis Hallucinations: None noted Delusions: None noted  Mental Status Report Appearance/Hygiene: Bizarre, In scrubs Eye Contact: Fair Motor Activity: Freedom of movement Speech: Tangential Level of Consciousness: Alert Mood: Pleasant Affect: Apprehensive, Appropriate to circumstance Anxiety Level: Minimal Thought Processes: Flight of Ideas, Tangential Judgement: Impaired Orientation: Person, Place, Time, Situation Obsessive Compulsive Thoughts/Behaviors: None  Cognitive Functioning Concentration: Poor Memory: Recent Impaired, Remote Impaired Is patient IDD: No Is patient DD?: No Insight: Poor Impulse Control: Poor Appetite: Fair Have you had any weight changes? : No Change Amount of the weight change? (lbs): 0 lbs Sleep: No Change Total Hours of Sleep: 7 Vegetative Symptoms: None  ADLScreening [facility] Assessment Services) Patient's cognitive ability adequate to safely complete daily activities?: Yes Patient able to express need for assistance with  ADLs?: Yes Independently performs ADLs?: Yes (appropriate for developmental age)  Prior Inpatient Therapy Prior Inpatient Therapy: Yes Prior Therapy Dates: 05/2017 [facility] Reason for Treatment: Psychosis  Prior Outpatient Therapy Prior Outpatient Therapy: No Does patient have an ACCT team?: No Does patient have Intensive In-House Services?  : No Does patient have [facility] services? : No Does patient have P4CC services?: No  ADL Screening (condition at time of admission) Patient's cognitive ability adequate to safely complete daily activities?: Yes Is the patient deaf or have difficulty hearing?: No Does the patient have difficulty seeing, even when wearing glasses/contacts?: No Does the patient have difficulty concentrating, remembering, or making decisions?: Yes Patient able to express need for assistance with ADLs?: Yes Does the patient have difficulty dressing or bathing?: No Independently performs ADLs?: Yes (appropriate for developmental age) Does the patient have difficulty walking or climbing stairs?: No       Abuse/Neglect Assessment (Assessment to be complete while patient is alone) Abuse/Neglect Assessment Can Be Completed: Yes Physical Abuse: Denies Verbal Abuse: Denies Sexual Abuse: Denies Exploitation of patient/patient's resources: Denies Self-Neglect: Denies     Merchant navy officerAdvance Directives (For Healthcare) Does Patient Have a Medical Advance Directive?: No Would patient like information on creating a medical advance directive?: No - Patient declined          Disposition:  Disposition Initial Assessment Completed for this Encounter: Yes Patient referred to: Other (Comment)   Dametri Ozburn 05/29/2017 2:45 PM

## 2017-05-29 NOTE — ED Notes (Signed)
Report given to [facility] RN 

## 2017-05-30 DIAGNOSIS — Z87891 Personal history of nicotine dependence: Secondary | ICD-10-CM

## 2017-05-30 DIAGNOSIS — F4325 Adjustment disorder with mixed disturbance of emotions and conduct: Secondary | ICD-10-CM

## 2017-05-30 DIAGNOSIS — R4587 Impulsiveness: Secondary | ICD-10-CM

## 2017-05-30 DIAGNOSIS — F23 Brief psychotic disorder: Secondary | ICD-10-CM

## 2017-05-30 DIAGNOSIS — F1099 Alcohol use, unspecified with unspecified alcohol-induced disorder: Secondary | ICD-10-CM

## 2017-05-30 DIAGNOSIS — Y906 Blood alcohol level of 120-199 mg/100 ml: Secondary | ICD-10-CM

## 2017-05-30 DIAGNOSIS — F1290 Cannabis use, unspecified, uncomplicated: Secondary | ICD-10-CM

## 2017-05-30 LAB — RAPID URINE DRUG SCREEN, HOSP PERFORMED
Amphetamines: NOT DETECTED
BARBITURATES: NOT DETECTED
Benzodiazepines: NOT DETECTED
COCAINE: NOT DETECTED
Opiates: NOT DETECTED
TETRAHYDROCANNABINOL: NOT DETECTED

## 2017-05-30 NOTE — ED Notes (Signed)
SBAR Report received from previous nurse. Pt received calm and visible on unit. Pt asleep and unwilling to participate in assessment of current SI/ HI, A/V H, depression, anxiety, or pain at this time, and appears otherwise stable and free of distress. Pt reminded of camera surveillance, q 15 min rounds, and rules of the milieu. Will continue to assess. 

## 2017-05-30 NOTE — ED Notes (Signed)
Pt in bed, eyes closed, appears to be sleeping. No s/s of distress noted, breathing non labored, respirations equal. Special checks q 15 mins in place for safety, Video monitoring in place. Will continue to monitor.

## 2017-05-30 NOTE — BH Assessment (Signed)
[facility]  Per Juanetta BeetsJacqueline Norman, DO, this pt requires psychiatric hospitalization at this time.  Berneice Heinrichina Tate, RN, [id number]; [facility] will be ready to receive pt at 22:00.  Pt presents under IVC initiated by law enforcement, and upheld by Dr Sharma CovertNorman, and IVC documents [facility].  Pt's nurse, Morrie Sheldonshley, has been notified, and agrees to call report to [phone].  Pt is to be transported via Patent examinerlaw enforcement.   Doylene Canninghomas Aashrith Eves, [place] Behavioral Health Coordinator [phone]

## 2017-05-30 NOTE — ED Notes (Signed)
Pt taking a shower 

## 2017-05-30 NOTE — ED Notes (Signed)
Pt sleeping when taking vital signs. RN notified. Will take vital signs when pt wakes up.  Pt appears to be resting comfortably, eyes closed, respirations even and unlabored.

## 2017-05-30 NOTE — Consult Note (Addendum)
[facility] Consult   Reason for Consult: Bizarre behavior  Referring Physician:  EDP Patient Identification: Jordan Gomez MRN:  [id number] Principal Diagnosis: Acute psychosis [facility]) Diagnosis:   Patient Active Problem List   Diagnosis Date Noted  . Adjustment disorder with mixed disturbance of emotions and conduct [F43.25] 05/16/2017  . Acute psychosis ([place]) [F23] 05/08/2017    Total Time spent with patient: 45 minutes  Subjective:   Jordan Gomez is a 27 y.o. male patient admitted with psychotic and bizarre behavior.  HPI:  Pt was seen and chart reviewed with treatment team and Dr Mariea Clonts. Pt denies suicidal/homicidal ideation, denies auditory/visual hallucinations and but does appear to be responding to internal stimuli. Pt stated he was not trying to run into traffic and he was just trying to cross the street when the cop stopped him and brought him to the hospital. Pt's UDS negative and BAL 153 on admission. Pt was agitated on admission and had to have emergency medications. Pt was seen in the ED three days ago for similar problem. Pt would benefit from an inpatient psychiatric admission for crisis stabilization and medication management.   Past Psychiatric History: As above  Risk to Self: Suicidal Ideation: No Suicidal Intent: No Is patient at risk for suicide?: No Suicidal Plan?: No Access to Means: No What has been your use of drugs/alcohol within the last 12 months?: pt denies Other Self Harm Risks: pt denies Triggers for Past Attempts: None known Intentional Self Injurious Behavior: None Risk to Others: Homicidal Ideation: No Thoughts of Harm to Others: No Current Homicidal Intent: No Current Homicidal Plan: No Access to Homicidal Means: No Identified Victim: N/A History of harm to others?: No Assessment of Violence: On admission Violent Behavior Description: None noted Does patient have access to weapons?: No Criminal Charges Pending?:  No Does patient have a court date: No Prior Inpatient Therapy: Prior Inpatient Therapy: Yes Prior Therapy Dates: 05/2017 Prior Therapy Facilty/Provider([facility] Reason for Treatment: Psychosis Prior Outpatient Therapy: Prior Outpatient Therapy: No Does patient have an ACCT team?: No Does patient have Intensive In-House Services?  : No Does patient have [facility] services? : No Does patient have P4CC services?: No  Past Medical History: History reviewed. No pertinent past medical history. History reviewed. No pertinent surgical history. Family History:  Family History  Family history unknown: Yes   Family Psychiatric  History: Unknown Social History:  Social History   Substance and Sexual Activity  Alcohol Use Yes  . Frequency: Never   Comment: seldom     Social History   Substance and Sexual Activity  Drug Use Yes  . Types: Marijuana   Comment: occasionally    Social History   Socioeconomic History  . Marital status: Single    Spouse name: Not on file  . Number of children: Not on file  . Years of education: Not on file  . Highest education level: Not on file  Occupational History  . Not on file  Social Needs  . Financial resource strain: Not on file  . Food insecurity:    Worry: Not on file    Inability: Not on file  . Transportation needs:    Medical: Not on file    Non-medical: Not on file  Tobacco Use  . Smoking status: Former Smoker    Types: Cigarettes  . Smokeless tobacco: Never Used  Substance and Sexual Activity  . Alcohol use: Yes    Frequency: Never    Comment: seldom  .  Drug use: Yes    Types: Marijuana    Comment: occasionally  . Sexual activity: Not Currently  Lifestyle  . Physical activity:    Days per week: Not on file    Minutes per session: Not on file  . Stress: Not on file  Relationships  . Social connections:    Talks on phone: Not on file    Gets together: Not on file    Attends religious service: Not on file     Active member of club or organization: Not on file    Attends meetings of clubs or organizations: Not on file    Relationship status: Not on file  Other Topics Concern  . Not on file  Social History Narrative  . Not on file   Additional Social History: N/A    Allergies:  No Known Allergies  Labs:  Results for orders placed or performed during the hospital encounter of 05/28/17 (from the past 48 hour(s))  Comprehensive metabolic panel     Status: Abnormal   Collection Time: 05/28/17 11:23 PM  Result Value Ref Range   Sodium 137 135 - 145 mmol/L   Potassium 3.5 3.5 - 5.1 mmol/L   Chloride 105 101 - 111 mmol/L   CO2 21 (L) 22 - 32 mmol/L   Glucose, Bld 80 65 - 99 mg/dL   BUN 19 6 - 20 mg/dL   Creatinine, Ser 0.85 0.61 - 1.24 mg/dL   Calcium 8.8 (L) 8.9 - 10.3 mg/dL   Total Protein 6.8 6.5 - 8.1 g/dL   Albumin 4.0 3.5 - 5.0 g/dL   AST 34 15 - 41 U/L   ALT 27 17 - 63 U/L   Alkaline Phosphatase 71 38 - 126 U/L   Total Bilirubin 0.8 0.3 - 1.2 mg/dL   GFR calc non Af Amer >60 >60 mL/min   GFR calc Af Amer >60 >60 mL/min    Comment: (NOTE) The eGFR has been calculated using the CKD EPI equation. This calculation has not been validated in all clinical situations. eGFR's persistently <60 mL/min signify possible Chronic Kidney Disease.    Anion gap 11 5 - 15    Comment: Performed at [facility], [place] [address] [place], [place] [address]  Ethanol     Status: Abnormal   Collection Time: 05/28/17 11:23 PM  Result Value Ref Range   Alcohol, Ethyl (B) 153 (H) <10 mg/dL    Comment:        LOWEST DETECTABLE LIMIT FOR SERUM ALCOHOL IS 10 mg/dL FOR MEDICAL PURPOSES ONLY Performed at [place] [address], [place], [place] [address]   Salicylate level     Status: None   Collection Time: 05/28/17 11:23 PM  Result Value Ref Range   Salicylate Lvl <0.3 2.8 - 30.0 mg/dL    Comment: Performed at [facility], [place]  [address] [place], [place] [address]  Acetaminophen level     Status: Abnormal   Collection Time: 05/28/17 11:23 PM  Result Value Ref Range   Acetaminophen (Tylenol), Serum <10 (L) 10 - 30 ug/mL    Comment:        THERAPEUTIC CONCENTRATIONS VARY SIGNIFICANTLY. A RANGE OF 10-30 ug/mL MAY BE AN EFFECTIVE CONCENTRATION FOR MANY PATIENTS. HOWEVER, SOME ARE BEST TREATED AT CONCENTRATIONS OUTSIDE THIS RANGE. ACETAMINOPHEN CONCENTRATIONS >150 ug/mL AT 4 HOURS AFTER INGESTION AND >50 ug/mL AT 12 HOURS AFTER INGESTION ARE OFTEN ASSOCIATED WITH TOXIC REACTIONS. Performed at [facility],  [place] [address], [place], [place] [address]   cbc     Status: Abnormal   Collection Time: 05/28/17 11:23 PM  Result Value Ref Range   WBC 6.5 4.0 - 10.5 K/uL   RBC 4.50 4.22 - 5.81 MIL/uL   Hemoglobin 13.2 13.0 - 17.0 g/dL   HCT 37.8 (L) 39.0 - 52.0 %   MCV 84.0 78.0 - 100.0 fL   MCH 29.3 26.0 - 34.0 pg   MCHC 34.9 30.0 - 36.0 g/dL   RDW 12.5 11.5 - 15.5 %   Platelets 267 150 - 400 K/uL    Comment: Performed at [facility], [place] [address], [place], [place] [address]  Rapid urine drug screen (hospital performed)     Status: None   Collection Time: 05/30/17  9:56 AM  Result Value Ref Range   Opiates NONE DETECTED NONE DETECTED   Cocaine NONE DETECTED NONE DETECTED   Benzodiazepines NONE DETECTED NONE DETECTED   Amphetamines NONE DETECTED NONE DETECTED   Tetrahydrocannabinol NONE DETECTED NONE DETECTED   Barbiturates NONE DETECTED NONE DETECTED    Comment: (NOTE) DRUG SCREEN FOR MEDICAL PURPOSES ONLY.  IF CONFIRMATION IS NEEDED FOR ANY PURPOSE, NOTIFY LAB WITHIN 5 DAYS. LOWEST DETECTABLE LIMITS FOR URINE DRUG SCREEN Drug Class                     Cutoff (ng/mL) Amphetamine and metabolites    1000 Barbiturate and metabolites    200 Benzodiazepine                 599 Tricyclics and metabolites     300 Opiates and metabolites        300 Cocaine and  metabolites        300 THC                            50 Performed at [facility], [place] [address] [place],  [address]     Current Facility-Administered Medications  Medication Dose Route Frequency Provider Last Rate Last Dose  . hydrOXYzine (ATARAX/VISTARIL) tablet 50 mg  50 mg Oral TID Ethelene Hal, NP   50 mg at 05/30/17 0917  . paliperidone (INVEGA) 24 hr tablet 6 mg  6 mg Oral Daily Ethelene Hal, NP   6 mg at 05/30/17 0917  . traZODone (DESYREL) tablet 50 mg  50 mg Oral QHS Ethelene Hal, NP   50 mg at 05/29/17 2114   Current Outpatient Medications  Medication Sig Dispense Refill  . haloperidol (HALDOL) 2 MG tablet Take 1 tablet (2 mg total) by mouth 2 (two) times daily. 60 tablet 0    Musculoskeletal: Strength & Muscle Tone: within normal limits Gait & Station: normal Patient leans: N/A  Psychiatric Specialty Exam: Physical Exam  Nursing note and vitals reviewed. Constitutional: He is oriented to person, place, and time. He appears well-developed and well-nourished.  HENT:  Head: Normocephalic and atraumatic.  Respiratory: Effort normal.  Musculoskeletal: Normal range of motion.  Neurological: He is alert and oriented to person, place, and time.  Psychiatric: His mood appears anxious. His affect is labile. His speech is rapid and/or pressured. He is agitated. Thought content is paranoid and delusional. Cognition and memory are normal. He expresses impulsivity.    Review of Systems  Psychiatric/Behavioral: Positive for substance abuse. Negative for hallucinations and suicidal ideas. The patient does not have insomnia.   All other  systems reviewed and are negative.   Blood pressure 110/65, pulse 71, temperature 98.2 F (36.8 C), temperature source Oral, resp. rate 14, SpO2 100 %.There is no height or weight on file to calculate BMI.  General Appearance: Disheveled  Eye Contact:  Good  Speech:  Pressured  Volume:   Normal  Mood:  Anxious and Irritable  Affect:  Congruent  Thought Process:  Disorganized  Orientation:  Full (Time, Place, and Person)  Thought Content:  Ideas of Reference:   Paranoia  Suicidal Thoughts:  No  Homicidal Thoughts:  No  Memory:  Immediate;   Good Recent;   Fair Remote;   Fair  Judgement:  Poor  Insight:  Lacking  Psychomotor Activity:  Increased  Concentration:  Concentration: Good and Attention Span: Fair  Recall:  Good  Fund of Knowledge:  Good  Language:  Good  Akathisia:  No  Handed:  Right  AIMS (if indicated):   N/A  Assets:  Agricultural consultant Housing  ADL's:  Intact  Cognition:  WNL  Sleep:   N/A     Treatment Plan Summary: Daily contact with patient to assess and evaluate symptoms and progress in treatment and Medication management (see MAR )  Disposition: Recommend psychiatric Inpatient admission when medically cleared.  Ethelene Hal, NP 05/30/2017 1:00 PM   Patient seen face-to-face for psychiatric evaluation, chart reviewed and case discussed with the physician extender and developed treatment plan. Reviewed the information documented and agree with the treatment plan.  Buford Dresser, DO 05/30/17 7:32 PM

## 2017-05-30 NOTE — ED Notes (Addendum)
Note in wrong chart.

## 2017-05-30 NOTE — ED Notes (Signed)
Pt guarded, forwards little with this nurse. Pt laughing inappropriately at times, disorganized thought content noted. Encouragement and support provided. Special checks q 15 mins in place for safety. Video monitoring in place. Will continue to monitor.

## 2017-05-31 ENCOUNTER — Inpatient Hospital Stay (HOSPITAL_COMMUNITY)
Admission: AD | Admit: 2017-05-31 | Discharge: 2017-06-06 | DRG: 885 | Disposition: A | Discharge status: Home / Self Care | Payer: No Typology Code available for payment source | Source: Intra-hospital | Attending: Psychiatry | Admitting: Psychiatry

## 2017-05-31 ENCOUNTER — Encounter (HOSPITAL_COMMUNITY): Payer: Self-pay

## 2017-05-31 ENCOUNTER — Other Ambulatory Visit: Payer: Self-pay

## 2017-05-31 DIAGNOSIS — F419 Anxiety disorder, unspecified: Secondary | ICD-10-CM | POA: Diagnosis present

## 2017-05-31 DIAGNOSIS — Z79899 Other long term (current) drug therapy: Secondary | ICD-10-CM | POA: Diagnosis not present

## 2017-05-31 DIAGNOSIS — Z590 Homelessness: Secondary | ICD-10-CM

## 2017-05-31 DIAGNOSIS — G4700 Insomnia, unspecified: Secondary | ICD-10-CM | POA: Diagnosis present

## 2017-05-31 DIAGNOSIS — Z87891 Personal history of nicotine dependence: Secondary | ICD-10-CM | POA: Diagnosis not present

## 2017-05-31 DIAGNOSIS — R450 Nervousness: Secondary | ICD-10-CM

## 2017-05-31 DIAGNOSIS — F1290 Cannabis use, unspecified, uncomplicated: Secondary | ICD-10-CM | POA: Diagnosis present

## 2017-05-31 DIAGNOSIS — Z818 Family history of other mental and behavioral disorders: Secondary | ICD-10-CM | POA: Diagnosis not present

## 2017-05-31 DIAGNOSIS — F203 Undifferentiated schizophrenia: Principal | ICD-10-CM | POA: Diagnosis present

## 2017-05-31 DIAGNOSIS — F329 Major depressive disorder, single episode, unspecified: Secondary | ICD-10-CM | POA: Diagnosis present

## 2017-05-31 DIAGNOSIS — R451 Restlessness and agitation: Secondary | ICD-10-CM | POA: Diagnosis not present

## 2017-05-31 MED ORDER — HALOPERIDOL 5 MG PO TABS
5.0000 mg | ORAL_TABLET | Freq: Two times a day (BID) | ORAL | Status: DC
  Administered 2017-05-31: 5 mg via ORAL
  Filled 2017-05-31 (×10): qty 1

## 2017-05-31 MED ORDER — ALUM & MAG HYDROXIDE-SIMETH 200-200-20 MG/5ML PO SUSP: 30.0000 mL | ORAL | Status: DC | PRN

## 2017-05-31 MED ORDER — MAGNESIUM HYDROXIDE 400 MG/5ML PO SUSP: 30.0000 mL | Freq: Every day | ORAL | Status: DC | PRN

## 2017-05-31 MED ORDER — HYDROXYZINE HCL 50 MG PO TABS
50.0000 mg | ORAL_TABLET | Freq: Three times a day (TID) | ORAL | Status: DC
  Filled 2017-05-31 (×4): qty 1

## 2017-05-31 MED ORDER — LORAZEPAM 1 MG PO TABS: 1.0000 mg | ORAL_TABLET | ORAL | Status: DC | PRN

## 2017-05-31 MED ORDER — ACETAMINOPHEN 325 MG PO TABS: 650.0000 mg | ORAL_TABLET | Freq: Four times a day (QID) | ORAL | Status: DC | PRN

## 2017-05-31 MED ORDER — ZIPRASIDONE MESYLATE 20 MG IM SOLR
20.0000 mg | INTRAMUSCULAR | Status: DC | PRN
  Filled 2017-05-31: qty 20

## 2017-05-31 MED ORDER — HYDROXYZINE HCL 50 MG PO TABS
50.0000 mg | ORAL_TABLET | Freq: Four times a day (QID) | ORAL | Status: DC | PRN
  Administered 2017-06-03: 50 mg via ORAL
  Filled 2017-05-31: qty 10
  Filled 2017-05-31: qty 1

## 2017-05-31 MED ORDER — HYDROXYZINE HCL 25 MG PO TABS: 25.0000 mg | ORAL_TABLET | Freq: Three times a day (TID) | ORAL | Status: DC | PRN

## 2017-05-31 MED ORDER — TRAZODONE HCL 50 MG PO TABS
50.0000 mg | ORAL_TABLET | Freq: Every evening | ORAL | Status: DC | PRN
  Administered 2017-06-03 – 2017-06-05 (×3): 50 mg via ORAL
  Filled 2017-05-31 (×5): qty 1

## 2017-05-31 MED ORDER — OLANZAPINE 10 MG PO TBDP: 10.0000 mg | ORAL_TABLET | Freq: Three times a day (TID) | ORAL | Status: DC | PRN

## 2017-05-31 MED ORDER — PALIPERIDONE ER 6 MG PO TB24
6.0000 mg | ORAL_TABLET | Freq: Every day | ORAL | Status: DC
  Filled 2017-05-31 (×2): qty 1

## 2017-05-31 MED ORDER — TRAZODONE HCL 50 MG PO TABS
50.0000 mg | ORAL_TABLET | Freq: Every day | ORAL | Status: DC
  Filled 2017-05-31: qty 1

## 2017-05-31 NOTE — Progress Notes (Signed)
Report given by Brett Canales, RN. Pt was oriented to unit. Pt offers no concerns/questions at this time. Pt appears to be resting comfortably in bed. Will continue to monitor.

## 2017-05-31 NOTE — Progress Notes (Signed)
  DATA ACTION RESPONSE  Objective- Pt. is visible in the dayroom, seen watching TV. No interaction.  Presents with an agitated/irritable affect and mood. Subjective- Denies having any SI/HI/AVH/Pain at this time.Pt states he gave haldol a try and told writer he does not want to take it anymore.Pt states he does not want to be on any medications. Pt left conversation with Clinical research associate and went to room. Remains safe on the unit.  1:1 interaction in private to establish rapport. Encouragement, education, & support given from staff.     Safety maintained with Q 15 checks. Continue with POC.

## 2017-05-31 NOTE — Progress Notes (Signed)
Pt is 27 year old male who was IVC by [employer] in TCU.  Pt initially on admit was violent and required restraints and medication.  Pt is currently calm and cooperative.  Pt has hx of adjustment d/o and acute psychosis.  Pt was found running in traffic and had a a verbal altercation at the [facility] [employer]" prior to admit.  Pt denies pain or discomfort. Pt denies SI, HI and AVH.  Pt contracts for safety.  Pt denies any problems and sts he does not abuse ETOH, drugs or any other substance.  Despite denial of ETOH use, pr admists to be often intoxicated.  Pt does not verbalize any connection to dring and being drunk.  Pt is poor historian and denies any problems.  Pt does not recall initial admission.  Pt lives in a tent x 5 months, has poor hygiene, sts he has not local support system.  Pt refuses any access to social services or any housing assistance.   Pt is known to ED staff and was last seen 3 days prior to admission. Pt is hungry and given meal.  Pt to room and goes to sleep.  Pt remains safe on unit.

## 2017-05-31 NOTE — Progress Notes (Signed)
Patient ID: Jordan Gomez, male   DOB: 07/11/1990, 27 y.o.   MRN: [id number]    D: Pt has been very irritable on the unit today, he reported that he was tired of people trying to make him take medication. Pt refused all morning medication, Dr. Nancy Fetter was made aware. Dr. Nancy Fetter met with patient, new orders were noted to start Haldol. Pt took Haldol without any problems. Pt did attend all groups and engaged in treatment. Pt reported that his depression was a 0, his hopelessness was a 0, and his anxiety was a 0. Pt reported that his goal for today was to feel happy. Pt reported being negative SI/HI, no AH/VH noted. A: 15 min checks continued for patient safety. R: Pt safety maintained.

## 2017-05-31 NOTE — H&P (Signed)
Psychiatric Admission Assessment Adult  Patient Identification: Jordan Gomez MRN:  [id number] Date of Evaluation:  05/31/2017 Chief Complaint:  schizophrenia Principal Diagnosis: Schizophrenia, undifferentiated (HCC) Diagnosis:   Patient Active Problem List   Diagnosis Date Noted  . 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 Acute psychosis (HCC) [F23] 05/08/2017   History of Present Illness:   Jordan Gomez is a 27 y/o M with history of psychosis and recent history of admission to [facility] with discharge on 05/13/17 for psychosis who was admitted from [facility] on IVC after he was brought in by [employer] due to disorganized behaviors, agitation, and walking out into traffic. Pt was agitated in the ED and required IM geodon to manage his behaviors. Pt has recent history of multiple presentations to the ED due to mental health concerns, mostly with vague complaints and disorganized thought pattern. Pt was transferred to [facility] for additional treatment and stabilization.  Upon initial presentation, pt shares, "I was leaving from [employer] and I thought I heard someone talking smack, so I responded, and I guess I sounded kind of upset. I guess I had been walking through traffic apparently."  Pt is generally pleasant during interview, but he is vague and somewhat circumstantial with his responses. Pt denies that his actions were in any way reflective of an attempt to harm himself. He denies SI/HI/AH/VH. He does endorses some anxiety and depressed mood, and he cites stressor of ongoing homelessness and being asked to leave the tent where he was staying prior to previous admission. He notes he has been staying, "wherever I can find a safe spot," in recent weeks. He denies symptoms of mania, OCD, and PTSD. Pt reports that he drinks a few times per week and had "about three beers" on night of admission. He reports last using cannabis about 1 month ago and he denies all other illicit  substance use.  Discussed with patient about treatment options. He previously had refused all psychotropic medications, but he is open to trial of psychotropic medication at this time. He demonstrates improved insight, stating, "I want to be the one to get help rather than have someone tell me I have to get help." He is in agreement to attempt trial of haldol. He had no further questions, comments, or concerns.  Associated Signs/Symptoms: Depression Symptoms:  depressed mood, anxiety, (Hypo) Manic Symptoms:  Distractibility, Anxiety Symptoms:  Excessive Worry, Psychotic Symptoms:  disrorganized thoughts PTSD Symptoms: NA Total Time spent with patient: 1 hour  Past Psychiatric History:  -previous treatment for psychosis and disorganized thoughts - 1 previous inpatient stay to [facility] on 05/09/17 - Pt has not followed up at [facility] after being set up for outpt follow up - No suicide attempt history  Is the patient at risk to self? Yes.    Has the patient been a risk to self in the past 6 months? Yes.    Has the patient been a risk to self within the distant past? Yes.    Is the patient a risk to others? Yes.    Has the patient been a risk to others in the past 6 months? Yes.    Has the patient been a risk to others within the distant past? Yes.     Prior Inpatient Therapy:   Prior Outpatient Therapy:    Alcohol Screening: Patient refused Alcohol Screening Tool: Yes Intervention/Follow-up: Patient Refused Substance Abuse History in the last 12 months:  Yes.   Consequences of Substance Abuse: Medical Consequences:  worsenened  psychosis Previous Psychotropic Medications: No  Psychological Evaluations: Yes  Past Medical History: History reviewed. No pertinent past medical history. History reviewed. No pertinent surgical history. Family History:  Family History  Family history unknown: Yes   Family Psychiatric  History: pt's mother had depression and suicide attempt. Tobacco Screening:  Have you used any form of tobacco in the last 30 days? (Cigarettes, Smokeless Tobacco, Cigars, and/or Pipes): Patient Refused Screening Social History: Pt was raised in [place] and he has lived in [place] for about 5 months. He is homeless and lives on the streets. He does not work. He does not have children. He does not have legal or trauma history.  Social History   Substance and Sexual Activity  Alcohol Use Yes  . Frequency: Never   Comment: seldom     Social History   Substance and Sexual Activity  Drug Use Yes  . Types: Marijuana   Comment: occasionally    Additional Social History:      Pain Medications: Please see MAR Prescriptions: Please see MAR Over the Counter: Please see MAR History of alcohol / drug use?: Yes Longest period of sobriety (when/how long): Unknown Negative Consequences of Use: (denies) Withdrawal Symptoms: Other (Comment)(denies symptoms) Name of Substance 1: Marijuana 1 - Age of First Use: Unknown 1 - Amount (size/oz): Unknown 1 - Frequency: Pt states he uses infrequently 1 - Duration: Unknown 1 - Last Use / Amount: unknown Name of Substance 2: EtOH 2 - Age of First Use: Unknown 2 - Amount (size/oz): 2 drinks 2 - Frequency: Pt states he uses infrequently 2 - Duration: Unknown 2 - Last Use / Amount: 05/24/17                Allergies:  No Known Allergies Lab Results:  Results for orders placed or performed during the hospital encounter of 05/28/17 (from the past 48 hour(s))  Rapid urine drug screen (hospital performed)     Status: None   Collection Time: 05/30/17  9:56 AM  Result Value Ref Range   Opiates NONE DETECTED NONE DETECTED   Cocaine NONE DETECTED NONE DETECTED   Benzodiazepines NONE DETECTED NONE DETECTED   Amphetamines NONE DETECTED NONE DETECTED   Tetrahydrocannabinol NONE DETECTED NONE DETECTED   Barbiturates NONE DETECTED NONE DETECTED    Comment: (NOTE) DRUG SCREEN FOR MEDICAL PURPOSES ONLY.  IF CONFIRMATION IS  NEEDED FOR ANY PURPOSE, NOTIFY LAB WITHIN 5 DAYS. LOWEST DETECTABLE LIMITS FOR URINE DRUG SCREEN Drug Class                     Cutoff (ng/mL) Amphetamine and metabolites    1000 Barbiturate and metabolites    200 Benzodiazepine                 200 Tricyclics and metabolites     300 Opiates and metabolites        300 Cocaine and metabolites        300 THC                            50 Performed at [facility], [address], [place], [place] [address]     Blood Alcohol level:  Lab Results  Component Value Date   ETH 153 (H) 05/28/2017   ETH <10 05/24/2017    Metabolic Disorder Labs:  No results found for: HGBA1C, MPG No results found for: PROLACTIN No results found for: CHOL, TRIG,  HDL, CHOLHDL, VLDL, LDLCALC  Current Medications: Current Facility-Administered Medications  Medication Dose Route Frequency Provider Last Rate Last Dose  . acetaminophen (TYLENOL) tablet 650 mg  650 mg Oral Q6H PRN Laveda Abbe, NP      . alum & mag hydroxide-simeth (MAALOX/MYLANTA) 200-200-20 MG/5ML suspension 30 mL  30 mL Oral Q4H PRN Laveda Abbe, NP      . haloperidol (HALDOL) tablet 5 mg  5 mg Oral BID Micheal Likens, MD   5 mg at 05/31/17 1044  . hydrOXYzine (ATARAX/VISTARIL) tablet 50 mg  50 mg Oral Q6H PRN Micheal Likens, MD      . OLANZapine zydis (ZYPREXA) disintegrating tablet 10 mg  10 mg Oral Q8H PRN Micheal Likens, MD       And  . LORazepam (ATIVAN) tablet 1 mg  1 mg Oral PRN Micheal Likens, MD       And  . ziprasidone (GEODON) injection 20 mg  20 mg Intramuscular PRN Micheal Likens, MD      . magnesium hydroxide (MILK OF MAGNESIA) suspension 30 mL  30 mL Oral Daily PRN Laveda Abbe, NP      . traZODone (DESYREL) tablet 50 mg  50 mg Oral QHS PRN Laveda Abbe, NP       PTA Medications: Medications Prior to Admission  Medication Sig Dispense Refill Last Dose  . haloperidol  (HALDOL) 2 MG tablet Take 1 tablet (2 mg total) by mouth 2 (two) times daily. 60 tablet 0 not started    Musculoskeletal: Strength & Muscle Tone: within normal limits Gait & Station: normal Patient leans: N/A  Psychiatric Specialty Exam: Physical Exam  Nursing note and vitals reviewed.   Review of Systems  Constitutional: Negative for chills and fever.  Respiratory: Negative for cough and shortness of breath.   Cardiovascular: Negative for chest pain.  Gastrointestinal: Negative for abdominal pain, heartburn, nausea and vomiting.  Psychiatric/Behavioral: Positive for substance abuse. Negative for depression, hallucinations and suicidal ideas. The patient is nervous/anxious.     Blood pressure 110/70, pulse 86, temperature 98.4 F (36.9 C), temperature source Oral, resp. rate 18, height  (1.626 m), weight 59.4 kg (131 lb), SpO2 98 %.Body mass index is 22.49 kg/m.  General Appearance: Casual, Disheveled and malodorous.   Eye Contact:  Good  Speech:  Clear and Coherent and Normal Rate  Volume:  Normal  Mood:  Euthymic  Affect:  Appropriate and Blunt  Thought Process:  Disorganized and Descriptions of Associations: Circumstantial  Orientation:  Full (Time, Place, and Person)  Thought Content:  Tangential and Abstract Reasoning  Suicidal Thoughts:  No  Homicidal Thoughts:  No  Memory:  Immediate;   Fair Recent;   Fair Remote;   Fair  Judgement:  Poor  Insight:  Lacking  Psychomotor Activity:  Normal  Concentration:  Concentration: Fair  Recall:  [employer] of Knowledge:  Fair  Language:  Fair  Akathisia:  No  Handed:    AIMS (if indicated):     Assets:  Communication Skills Desire for Improvement Resilience  ADL's:  Intact  Cognition:  WNL  Sleep:  Number of Hours: 3.25(new admit )   Treatment Plan Summary: Daily contact with patient to assess and evaluate symptoms and progress in treatment and Medication management  Observation Level/Precautions:  15 minute  checks  Laboratory:  CBC Chemistry Profile HbAIC UDS UA  Psychotherapy:  Encourage participation in groups and therapeutic milieu   Medications:  Start haldol  po BID. Start atarax  po q6h prn anxiety. Start trazodone  po qhs prn insomnia. Start agitation protocol with zydis/ativan/geodon PRN agitation.   Consultations:    Discharge Concerns:    Estimated LOS: 5-7 days  Other:     Physician Treatment Plan for Primary Diagnosis: Schizophrenia, undifferentiated (HCC) Long Term Goal(s): Improvement in symptoms so as ready for discharge  Short Term Goals: Compliance with prescribed medications will improve  Physician Treatment Plan for Secondary Diagnosis: Principal Problem:   Schizophrenia, undifferentiated (HCC)  Long Term Goal(s): Improvement in symptoms so as ready for discharge  Short Term Goals: Ability to identify and develop effective coping behaviors will improve  I certify that inpatient services furnished can reasonably be expected to improve the patient's condition.    Micheal Likens, MD 4/27/201911:03 AM

## 2017-05-31 NOTE — [facility] Group Notes (Signed)
[facility] Group Notes:  (Nursing/MHT/Case Management/Adjunct)  Date:  05/31/2017  Time:  11:03 AM  Type of Therapy:  Psychoeducational Skills  Participation Level:  Active  Participation Quality:  Appropriate  Affect:  Appropriate  Cognitive:  Appropriate  Insight:  Appropriate  Engagement in Group:  Engaged  Modes of Intervention:  Problem-solving  Summary of Progress/Problems: Pt attended Psychoeducational group with top topic anger management. [name] 05/31/2017, 11:03 AM

## 2017-05-31 NOTE — [facility] Group Notes (Signed)
[facility] Group Notes:  (Nursing/MHT/Case Management/Adjunct)  Date:  05/31/2017  Time:  10:56 AM  Type of Therapy:  Goals/Orientation Group.  Participation Level:  Active  Participation Quality:  Appropriate  Affect:  Appropriate  Cognitive:  Appropriate  Insight:  Appropriate  Engagement in Group:  Engaged  Modes of Intervention:  Discussion  Summary of Progress/Problems: Pt attended goals/orientation group, pt was receptive.  [name] 05/31/2017, 10:56 AM

## 2017-05-31 NOTE — Tx Team (Signed)
Initial Treatment Plan 05/31/2017 3:07 AM Jordan Gomez ZOX:[id number]    PATIENT STRESSORS: Financial difficulties Medication change or noncompliance Substance abuse   PATIENT STRENGTHS: Average or above average intelligence Physical Health   PATIENT IDENTIFIED PROBLEMS: Anger anxiety "I need to get in my head to see whats going on"    SI "I dont think I want to hurt myself.  Maybe medications?"                 DISCHARGE CRITERIA:  Ability to meet basic life and health needs Adequate post-discharge living arrangements Improved stabilization in mood, thinking, and/or behavior Medical problems require only outpatient monitoring Motivation to continue treatment in a less acute level of care Need for constant or close observation no longer present  PRELIMINARY DISCHARGE PLAN: Attend aftercare/continuing care group Outpatient therapy Placement in alternative living arrangements  PATIENT/FAMILY INVOLVEMENT: This treatment plan has been presented to and reviewed with the patient, Jordan Gomez.  The patient has been given the opportunity to ask questions and make suggestions.  Sylvan Cheese, RN 05/31/2017, 3:07 AM

## 2017-05-31 NOTE — ED Notes (Signed)
[employer] called for transport. Report given to Brett Canales at [facility]

## 2017-05-31 NOTE — [facility] Group Notes (Signed)
  [facility] LCSW Group Therapy Note  Date/Time:  05/31/2017 11:15AM-12:00PM  Type of Therapy and Topic:  Group Therapy:  Feelings About Hospitalization  Participation Level:  Active   Description of Group This process group involved patients discussing their feelings related to being hospitalized, as well as the benefits they see to being in the hospital.  These feelings and benefits were itemized.  The group then brainstormed specific ways in which they could seek those same benefits when they discharge and return home.  Therapeutic Goals 1. Patient will identify and describe positive and negative feelings related to hospitalization 2. Patient will verbalize benefits of hospitalization to themselves personally 3. Patients will brainstorm together ways they can obtain similar benefits in the outpatient setting, identify barriers to wellness and possible solutions  Summary of Patient Progress:  The patient expressed his primary feelings about being hospitalized are "not too fond" but feels that this particular hospital is okay.  He stated that depending on the reason he has been hospitalized, he has been treated differently at different places, and that has not made him feel good.  He specified that one thing which really angers him is when he is told how he was acting prior to hospitalizations, adding "how do you know how I was acting?  I am inside me.  I'm the one who knows."  He said he does feel he needs and can get some help.  He gave a lot of encouragement to other patients, responding to questions frequently and thoughtfully.    Therapeutic Modalities Cognitive Behavioral Therapy Motivational Interviewing    Ambrose Mantle, LCSW 05/31/2017, 12:48 PM

## 2017-05-31 NOTE — B[facility] Suicide Risk Assessment (Signed)
[facility] Admission Suicide Risk Assessment   Nursing information obtained from:    Demographic factors:    Current Mental Status:    Loss Factors:    Historical Factors:    Risk Reduction Factors:     Total Time spent with patient: 1 hour Principal Problem: Schizophrenia, undifferentiated (HCC) Diagnosis:   Patient Active Problem List   Diagnosis Date Noted  . 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 Acute psychosis (HCC) [F23] 05/08/2017   Subjective Data: See H&P for details  Continued Clinical Symptoms:    The "Alcohol Use Disorders Identification Test", Guidelines for Use in Primary Care, Second Edition.  World Science writer [facility]). Score between 0-7:  no or low risk or alcohol related problems. Score between 8-15:  moderate risk of alcohol related problems. Score between 16-19:  high risk of alcohol related problems. Score 20 or above:  warrants further diagnostic evaluation for alcohol dependence and treatment.   CLINICAL FACTORS:   Severe Anxiety and/or Agitation Alcohol/Substance Abuse/Dependencies Schizophrenia:   Paranoid or undifferentiated type Currently Psychotic Unstable or Poor Therapeutic Relationship Previous Psychiatric Diagnoses and Treatments   Musculoskeletal: Strength & Muscle Tone: within normal limits Gait & Station: normal Patient leans: N/A  Psychiatric Specialty Exam: Physical Exam  Nursing note and vitals reviewed.   ROS- See H&P for details  Blood pressure 110/70, pulse 86, temperature 98.4 F (36.9 C), temperature source Oral, resp. rate 18, height  (1.626 m), weight 59.4 kg (131 lb), SpO2 98 %.Body mass index is 22.49 kg/m.   COGNITIVE FEATURES THAT CONTRIBUTE TO RISK:  None    SUICIDE RISK:   Mild:  Suicidal ideation of limited frequency, intensity, duration, and specificity.  There are no identifiable plans, no associated intent, mild dysphoria and related symptoms, good self-control (both objective and  subjective assessment), few other risk factors, and identifiable protective factors, including available and accessible social support.  PLAN OF CARE: See H&P for details  I certify that inpatient services furnished can reasonably be expected to improve the patient's condition.   Micheal Likens, MD 05/31/2017, 11:24 AM

## 2017-05-31 NOTE — BHH Counselor (Signed)
Adult Comprehensive Assessment  Patient ID: Jordan Gomez, male   DOB: 1990/05/08, 27 y.o.   MRN: [id number]  Information Source: Information source: Patient  Current Stressors:  Educational / Learning stressors:  Learns differently now than when younger, some stress from this Employment / Job issues:  Denies stressors - is unemployed Family Relationships: Pt states that he has a good relationship with his parents but does not talk to them.  Used to be able to communicate with them through FaceBook, so now cannot contact them any longer.  Keeps forgetting the password. Financial / Lack of resources (include bankruptcy): Pt has no income, but he states this is not stressful. Housing / Lack of housing: Pt is living in a tent and states this is not stressful. Physical health (include injuries & life threatening diseases): Denies stressors Social relationships: Pt has few social relationships, but he states this is not stressful.   Substance abuse: Denies stressors Bereavement / Loss:   Denies stressors  Living/Environment/Situation:  Living Arrangements: Homeless Living conditions (as described by patient or guardian): On the streets, does not like shelters How long has patient lived in current situation?: Does not like being in a tent, where he had stayed prior to last admission.  He returned to his tent at last discharge, but somebody started "drama with me so I walked away." What is atmosphere in current home: Temporary, Dangerous  Family History:  Marital status: Single Are you sexually active?: No Does patient have children?: No Sexually active:  Prefers to skip   Childhood History:  By whom was/is the patient raised?: Both parents, Mother/father and step-parent Additional childhood history information: Pt states he was in "placement" from age 86 to 48.  He is unsure what type of placement this was.  Description of patient's relationship with caregiver when they were a  child: Not good with mother and father but great with step-father "My step-father had a way of talking to me and making time for me" Patient's description of current relationship with people who raised him/her: "It's going well" Does patient have siblings?: Yes Number of Siblings: 3 Description of patient's current relationship with siblings: "They use to live with me but I don't talk to them much"  Did patient suffer any verbal/emotional/physical/sexual abuse as a child?: No Did patient suffer from severe childhood neglect?: No Has patient ever been sexually abused/assaulted/raped as an adolescent or adult?: No Was the patient ever a victim of a crime or a disaster?: No Witnessed domestic violence?: No Has patient been effected by domestic violence as an adult?: No  Education:  Highest grade of school patient has completed: 12th  Currently a student?: No Learning disability?: No  Employment/Work Situation:   Employment situation: Unemployed Patient's job has been impacted by current illness: Yes Describe how patient's job has been impacted: Mental health concerns, confusion   What is the longest time patient has a held a job?: Pt is unsure  Where was the patient employed at that time?: Pt is unsure  Has patient ever been in the [employer]?: No Has patient ever served in combat?: No Did You Receive Any Psychiatric Treatment/Services While in Equities trader?: No Are There Guns or Other Weapons in Your Home?: No  Financial Resources:   Financial resources: No income Does patient have a Lawyer or guardian?: No  Alcohol/Substance Abuse:   What has been your use of drugs/alcohol within the last 12 months?: Prefers to skip this question If attempted suicide, did drugs/alcohol play a  role in this?: No Alcohol/Substance Abuse Treatment Hx: Denies past history Has alcohol/substance abuse ever caused legal problems?: No  Social Support System:   Forensic psychologist  System: None Describe Community Support System: N/A Type of faith/religion: "I don't like to biased to just one"  How does patient's faith help to cope with current illness?: N/A  Leisure/Recreation:   Leisure and Hobbies: "Playing video and board games and anything else I find fun"  Strengths/Needs:   What things does the patient do well?: "I'm still trying to figure it out" In what areas does patient struggle / problems for patient: "Nothing"  Discharge Plan:   Does patient have access to transportation?: Yes Will patient be returning to same living situation after discharge?: No Plan for living situation after discharge: Pt is unsure where he will live after discharge Currently receiving community mental health services: No If no, would patient like referral for services when discharged?: No - refuses referrals "I'm not okay with that." Does patient have financial barriers related to discharge medications?: Yes Patient description of barriers related to discharge medications: Pt has no income or insurance   Summary/Recommendations:   Summary and Recommendations (to be completed by the evaluator): Patient is a 27yo male readmitted under IVC with acute psychosis and paranoia.  He was found running into traffic attempting to get hit by a car, and in the ED reported not knowing why the police brought him there.  At last discharge earlier this month, he refused all follow-up.  Between the two admissions, he had been in the ED several times.  Primary stressors appear to be an inability to get in touch with family members because he can no longer get on Facebook although he declines to explain reasons for this, being homeless because he did not like living in a tent and thus is staying on the streets, and having no income, although he himself denies having any stressors.  He denies having substance abuse issues but his BAC was 153 at admission.  Patient will benefit from crisis stabilization,  medication evaluation, group therapy and psychoeducation, in addition to case management for discharge planning. At discharge it is recommended that Patient adhere to the established discharge plan and continue in treatment.  Ambrose Mantle, LCSW 05/31/2017, 3:34 PM

## 2017-06-01 DIAGNOSIS — Z818 Family history of other mental and behavioral disorders: Secondary | ICD-10-CM

## 2017-06-01 NOTE — Progress Notes (Signed)
Nursing Progress Note: 7p-7a D: Pt currently presents with a flirtatious/childlike/limited/posturing/fidgety/anxious/ambivalent affect and behavior. Pt states "I have had better days. I am just trying to understand what is going on in my own head." Interacting intrusively with the milieu. Pt reports good sleep during the previous night with current medication regimen. Pt did attend wrap-up group.  A: Pt provided with medications per providers orders. Pt's labs and vitals were monitored throughout the night. Pt supported emotionally and encouraged to express concerns and questions. Pt educated on medications.  R: Pt's safety ensured with 15 minute and environmental checks. Pt currently denies SI, HI, and AVH. Pt verbally contracts to seek staff if SI,HI, or AVH occurs and to consult with staff before acting on any harmful thoughts. Will continue to monitor.

## 2017-06-01 NOTE — [facility] Group Notes (Signed)
[facility] Group Notes:  (Nursing/MHT/Case Management/Adjunct)  Date:  06/01/2017  Time:  11:51 AM  Type of Therapy:  Patient self inventory group and goals group  Participation Level:  Active  Participation Quality:  Inattentive  Affect:  Anxious and Resistant  Cognitive:  Disorganized  Insight:  Lacking  Engagement in Group:  Lacking  Modes of Intervention:  Education  Summary of Progress/Problems: Pt did attend patient self inventory group and goals group.  [name] 06/01/2017, 11:51 AM

## 2017-06-01 NOTE — Progress Notes (Signed)
Patient ID: Jordan Gomez, male   DOB: 02/14/1990, 27 y.o.   MRN: [id number]     D: Pt has been very irritable on the unit today, he reported that he was tired of people trying to make him take medication. Pt refused all morning medication. Pt did attend all groups and engaged in treatment. Pt reported that his depression was a 0, his hopelessness was a 0, and his anxiety was a 0. Pt reported that his goal for today was to feel happy. Pt reported being negative SI/HI, no AH/VH noted. A: 15 min checks continued for patient safety. R: Pt safety maintained.

## 2017-06-01 NOTE — Progress Notes (Signed)
[facility] MD Progress Note  06/01/2017 3:59 PM Jordan Gomez  MRN:  [id number]  Subjective: Jordan Gomez reports, "I'm doing very well. I came because I was not behaving out there. But, I'm feeling really well now. I don't need the medicines. I don't need the medicines because I don't want to get addicted to them. I'm sleeping well, eating good. I don't need to be here any more".  Jordan Gomez is a 27 y/o M with history of psychosis and recent history of admission to [facility] with discharge on 05/13/17 for psychosis who was admitted from [facility] on IVC after he was brought in by [employer] due to disorganized behaviors, agitation, and walking out into traffic. Pt was agitated in the ED and required IM geodon to manage his behaviors. Pt has recent history of multiple presentations to the ED due to mental health concerns, mostly with vague complaints and disorganized thought pattern. Pt was transferred to [facility] for additional treatment and stabilization. Upon initial presentation, pt shares, "I was leaving from [employer] and I thought I heard someone talking smack, so I responded, and I guess I sounded kind of upset. I guess I had been walking through traffic apparently."    Today, 06-01-17,  Jordan Gomez is seen, chart reviewed. The chart findings discussed with the treatment team. He presents alert, oriented x 4. He is visible on the unit, attending group sessions. He says he is doing well. Says he does no longer need medications as he does not want to develop drug addiction. He currently denies any other issues or concerns. He will continue current plan of care as recommended. No changes made.  Principal Problem: Schizophrenia, undifferentiated (HCC)  Diagnosis:   Patient Active Problem List   Diagnosis Date Noted  . 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 Acute psychosis (HCC) [F23] 05/08/2017   Total Time spent with patient: 25 minutes  Past Psychiatric History: See H&P  Past Medical  History: History reviewed. No pertinent past medical history. History reviewed. No pertinent surgical history.  Family History:  Family History  Family history unknown: Yes   Family Psychiatric  History: See H&P  Social History:  Social History   Substance and Sexual Activity  Alcohol Use Yes  . Frequency: Never   Comment: seldom     Social History   Substance and Sexual Activity  Drug Use Yes  . Types: Marijuana   Comment: occasionally    Social History   Socioeconomic History  . Marital status: Single    Spouse name: Not on file  . Number of children: Not on file  . Years of education: Not on file  . Highest education level: Not on file  Occupational History  . Not on file  Social Needs  . Financial resource strain: Not on file  . Food insecurity:    Worry: Not on file    Inability: Not on file  . Transportation needs:    Medical: Not on file    Non-medical: Not on file  Tobacco Use  . Smoking status: Former Smoker    Types: Cigarettes  . Smokeless tobacco: Never Used  . Tobacco comment: refused  Substance and Sexual Activity  . Alcohol use: Yes    Frequency: Never    Comment: seldom  . Drug use: Yes    Types: Marijuana    Comment: occasionally  . Sexual activity: Not Currently  Lifestyle  . Physical activity:    Days per week: Not on file  Minutes per session: Not on file  . Stress: Not on file  Relationships  . Social connections:    Talks on phone: Not on file    Gets together: Not on file    Attends religious service: Not on file    Active member of club or organization: Not on file    Attends meetings of clubs or organizations: Not on file    Relationship status: Not on file  Other Topics Concern  . Not on file  Social History Narrative  . Not on file   Additional Social History:  Pain Medications: Please see MAR Prescriptions: Please see MAR Over the Counter: Please see MAR History of alcohol / drug use?: Yes Longest period of  sobriety (when/how long): Unknown Negative Consequences of Use: (denies) Withdrawal Symptoms: Other (Comment)(denies symptoms) Name of Substance 1: Marijuana 1 - Age of First Use: Unknown 1 - Amount (size/oz): Unknown 1 - Frequency: Pt states he uses infrequently 1 - Duration: Unknown 1 - Last Use / Amount: unknown Name of Substance 2: EtOH 2 - Age of First Use: Unknown 2 - Amount (size/oz): 2 drinks 2 - Frequency: Pt states he uses infrequently 2 - Duration: Unknown 2 - Last Use / Amount: 05/24/17  Sleep: Good  Appetite:  Good  Current Medications: Current Facility-Administered Medications  Medication Dose Route Frequency Provider Last Rate Last Dose  . acetaminophen (TYLENOL) tablet 650 mg  650 mg Oral Q6H PRN Laveda Abbe, NP      . alum & mag hydroxide-simeth (MAALOX/MYLANTA) 200-200-20 MG/5ML suspension 30 mL  30 mL Oral Q4H PRN Laveda Abbe, NP      . haloperidol (HALDOL) tablet 5 mg  5 mg Oral BID Micheal Likens, MD   5 mg at 05/31/17 1044  . hydrOXYzine (ATARAX/VISTARIL) tablet 50 mg  50 mg Oral Q6H PRN Micheal Likens, MD      . OLANZapine zydis (ZYPREXA) disintegrating tablet 10 mg  10 mg Oral Q8H PRN Micheal Likens, MD       And  . LORazepam (ATIVAN) tablet 1 mg  1 mg Oral PRN Micheal Likens, MD       And  . ziprasidone (GEODON) injection 20 mg  20 mg Intramuscular PRN Micheal Likens, MD      . magnesium hydroxide (MILK OF MAGNESIA) suspension 30 mL  30 mL Oral Daily PRN Laveda Abbe, NP      . traZODone (DESYREL) tablet 50 mg  50 mg Oral QHS PRN Laveda Abbe, NP       Lab Results: No results found for this or any previous visit (from the past 48 hour(s)).  Blood Alcohol level:  Lab Results  Component Value Date   ETH 153 (H) 05/28/2017   ETH <10 05/24/2017   Metabolic Disorder Labs: No results found for: HGBA1C, MPG No results found for: PROLACTIN No results found for: CHOL,  TRIG, HDL, CHOLHDL, VLDL, LDLCALC  Physical Findings: AIMS: Facial and Oral Movements Muscles of Facial Expression: None, normal Lips and Perioral Area: None, normal Jaw: None, normal Tongue: None, normal,Extremity Movements Upper (arms, wrists, hands, fingers): None, normal Lower (legs, knees, ankles, toes): None, normal, Trunk Movements Neck, shoulders, hips: None, normal, Overall Severity Severity of abnormal movements (highest score from questions above): None, normal Incapacitation due to abnormal movements: None, normal Patient's awareness of abnormal movements (rate only patient's report): No Awareness, Dental Status Current problems with teeth and/or dentures?: No Does patient usually wear  dentures?: No  CIWA:    COWS:     Musculoskeletal: Strength & Muscle Tone: within normal limits Gait & Station: normal Patient leans: N/A  Psychiatric Specialty Exam: Physical Exam  Nursing note and vitals reviewed.   ROS  Blood pressure 112/87, pulse 87, temperature (!) 97.5 F (36.4 C), temperature source Oral, resp. rate 18, height  (1.626 m), weight 59.4 kg (131 lb), SpO2 98 %.Body mass index is 22.49 kg/m.  General Appearance: Casual, Disheveled and malodorous.   Eye Contact:  Good  Speech:  Clear and Coherent and Normal Rate  Volume:  Normal  Mood:  Euthymic  Affect:  Appropriate and Blunt  Thought Process:  Disorganized and Descriptions of Associations: Circumstantial  Orientation:  Full (Time, Place, and Person)  Thought Content:  Tangential and Abstract Reasoning  Suicidal Thoughts:  No  Homicidal Thoughts:  No  Memory:  Immediate;   Fair Recent;   Fair Remote;   Fair  Judgement:  Poor  Insight:  Lacking  Psychomotor Activity:  Normal  Concentration:  Concentration: Fair  Recall:  [employer] of Knowledge:  Fair  Language:  Fair  Akathisia:  No  Handed:    AIMS (if indicated):     Assets:  Communication Skills Desire for Improvement Resilience  ADL's:   Intact  Cognition:  WNL  Sleep:  Number of Hours: 6.75     Treatment Plan Summary: Daily contact with patient to assess and evaluate symptoms and progress in treatment.  - Continue inpatient hospitalization.  - Will continue today 06/01/2017 plan as below except where it is noted.  Mood control.      - Continue Haldol 5 mg po bid.  Anxiety.      - Continue Hydroxyzine 50 mg Q 6 hours prn.  Agitation/psychosis.      - Continue Olanzapine disintegrating tablets 10 mg po Q 8 hrs prn.      - Continue Lorazepam 1 mg po prn x 1 dose.      - Continue Geodon 20 mg IM prn x 1 dose.  Insomnia.      - Trazodone 50 po prn Q hs.       - Patient to continue to participate in the group sessions.      - Discharge disposition plan is ongoing.  Armandina Stammer, NP, PMHNP, FNP-BC. 06/01/2017, 3:59 PM

## 2017-06-01 NOTE — [facility] Group Notes (Signed)
[facility] Group Notes:  (Nursing/MHT/Case Management/Adjunct)  Date:  06/01/2017  Time:  11:53 AM  Type of Therapy:  Psychoeducational Skills  Participation Level:  Active  Participation Quality:  Appropriate  Affect:  Appropriate  Cognitive:  Appropriate  Insight:  Appropriate  Engagement in Group:  Engaged  Modes of Intervention:  Problem-solving  Summary of Progress/Problems: Pt attended Psychoeducational group with top topic healthy support systems.  Jordan Gomez 06/01/2017, 11:53 AM

## 2017-06-02 MED ORDER — HALOPERIDOL 5 MG PO TABS
5.0000 mg | ORAL_TABLET | Freq: Two times a day (BID) | ORAL | Status: DC
  Administered 2017-06-02 – 2017-06-06 (×8): 5 mg via ORAL
  Filled 2017-06-02: qty 14
  Filled 2017-06-02 (×4): qty 1
  Filled 2017-06-02: qty 14
  Filled 2017-06-02 (×7): qty 1

## 2017-06-02 MED ORDER — HALOPERIDOL LACTATE 5 MG/ML IJ SOLN
5.0000 mg | Freq: Two times a day (BID) | INTRAMUSCULAR | Status: DC
  Filled 2017-06-02 (×13): qty 1

## 2017-06-02 NOTE — Progress Notes (Signed)
Recreation Therapy Notes  Patient admitted to unit 4.27.19. Due to admission within last year, no new assessment conducted at this time. Last assessment conducted 4.5.19. Patient reports no changes in stressors from previous admission.  Pt stated he was admitted because he became angry when he thought someone said something to him as he was leaving [employer].  Patient denies SI, HI, AVH at this time. Patient reports goal of "better guide my emotions".  Information found below from assessment conducted 4.5.19:   Coping Skills:  Isolation, Sports, Avoidance, Deep Breathing, Arguments, Aggression, Impulsivity, Write, Read, Talk, Art, Substance Abuse, Music, Meditate, Exercise, Dance  Leisure Interests: Reading, Video Games, [employer], Walking, Basketball  Patient Strengths: Patient was not be able to identify any strengths.  Areas of Improvement:  Patience    Caroll Rancher, LRT/CTRS    Caroll Rancher A 06/02/2017 2:53 PM

## 2017-06-02 NOTE — [facility] Group Notes (Signed)
LCSW Group Therapy Note   06/02/2017 1:15pm   Type of Therapy and Topic:  Group Therapy:  Overcoming Obstacles   Participation Level:  Active   Description of Group:    In this group patients will be encouraged to explore what they see as obstacles to their own wellness and recovery. They will be guided to discuss their thoughts, feelings, and behaviors related to these obstacles. The group will process together ways to cope with barriers, with attention given to specific choices patients can make. Each patient will be challenged to identify changes they are motivated to make in order to overcome their obstacles. This group will be process-oriented, with patients participating in exploration of their own experiences as well as giving and receiving support and challenge from other group members.   Therapeutic Goals: 1. Patient will identify personal and current obstacles as they relate to admission. 2. Patient will identify barriers that currently interfere with their wellness or overcoming obstacles.  3. Patient will identify feelings, thought process and behaviors related to these barriers. 4. Patient will identify two changes they are willing to make to overcome these obstacles:      Summary of Patient Progress   Stayed the entire time, engaged throughout.  Brought up the issue of meds and not wanting to take them.  But rather than saying it had to do with meds themselves, stated it is about trusting those who prescribe them.  "I guess I just have trust issues.  That's as far as his insight went.   Therapeutic Modalities:   Cognitive Behavioral Therapy Solution Focused Therapy Motivational Interviewing Relapse Prevention Therapy  Ida Rogue, LCSW 06/02/2017 4:06 PM

## 2017-06-02 NOTE — Plan of Care (Signed)
Patient is safe and free from injury.  Routine safety checks maintained every 15 minutes. 

## 2017-06-02 NOTE — Progress Notes (Addendum)
[facility] MD Progress Note  06/02/2017 12:48 PM Jordan Gomez  MRN:  [id number] Subjective:    Jordan Gomez is a 27 y/o M with history of psychosis and recent history of admission to [facility] with discharge on 05/13/17 for psychosis who was admitted from [facility] on IVC after he was brought in by [employer] due to disorganized behaviors, agitation, and walking out into traffic. Pt was agitated in the ED and required IM geodon to manage his behaviors. Pt has recent history of multiple presentations to the ED due to mental health concerns, mostly with vague complaints and disorganized thought pattern. Pt was transferred to [facility] for additional treatment and stabilization. He was started on regimen of haldol at time of initial intake; however, he only took one dose, and he has been refusing since that time.  Today upon evaluation, pt shares, "I'm doing good." Pt is pleasant and reports that his mood is good. He continues to be vague and mildly disorganized, providing tangential responses to most questions. He denies SI/HI/AH/VH. He notes that his sleep was poor last night as he was unable to get back to sleep after waking up. When asked why he has been refusing his prescribed medications, pt shares, "I took it that once, and it had an effect. I just don't want to develop a dependence." Discussed with patient that medication needs to be taken daily and it is not a medication which has risk of habit formation/dependence. However, pt continued to decline to be restarted on any medication. Discussed with patient that due to his ongoing symptoms, poor insight, and multiple recent hospital presentations, that recommendation is for him to take medication daily. Pt grew irritable and stated, "I feel like this is really one-sided," and then he abruptly left the interview. We will plan to obtain second opinion for forced medications.  Addendum: Second opinion completed by Dr. Jola Babinski for forced medications (and placed at  front of paper chart). Pt will be started on haldol IM if he refuses oral medication.  Principal Problem: Schizophrenia, undifferentiated (HCC) Diagnosis:   Patient Active Problem List   Diagnosis Date Noted  . 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 Acute psychosis (HCC) [F23] 05/08/2017   Total Time spent with patient: 30 minutes  Past Psychiatric History: see H&P  Past Medical History: History reviewed. No pertinent past medical history. History reviewed. No pertinent surgical history. Family History:  Family History  Family history unknown: Yes   Family Psychiatric  History: see H&P Social History:  Social History   Substance and Sexual Activity  Alcohol Use Yes  . Frequency: Never   Comment: seldom     Social History   Substance and Sexual Activity  Drug Use Yes  . Types: Marijuana   Comment: occasionally    Social History   Socioeconomic History  . Marital status: Single    Spouse name: Not on file  . Number of children: Not on file  . Years of education: Not on file  . Highest education level: Not on file  Occupational History  . Not on file  Social Needs  . Financial resource strain: Not on file  . Food insecurity:    Worry: Not on file    Inability: Not on file  . Transportation needs:    Medical: Not on file    Non-medical: Not on file  Tobacco Use  . Smoking status: Former Smoker    Types: Cigarettes  . Smokeless tobacco: Never Used  . Tobacco comment: refused  Substance and Sexual Activity  . Alcohol use: Yes    Frequency: Never    Comment: seldom  . Drug use: Yes    Types: Marijuana    Comment: occasionally  . Sexual activity: Not Currently  Lifestyle  . Physical activity:    Days per week: Not on file    Minutes per session: Not on file  . Stress: Not on file  Relationships  . Social connections:    Talks on phone: Not on file    Gets together: Not on file    Attends religious service: Not on file    Active  member of club or organization: Not on file    Attends meetings of clubs or organizations: Not on file    Relationship status: Not on file  Other Topics Concern  . Not on file  Social History Narrative  . Not on file   Additional Social History:    Pain Medications: Please see MAR Prescriptions: Please see MAR Over the Counter: Please see MAR History of alcohol / drug use?: Yes Longest period of sobriety (when/how long): Unknown Negative Consequences of Use: (denies) Withdrawal Symptoms: Other (Comment)(denies symptoms) Name of Substance 1: Marijuana 1 - Age of First Use: Unknown 1 - Amount (size/oz): Unknown 1 - Frequency: Pt states he uses infrequently 1 - Duration: Unknown 1 - Last Use / Amount: unknown Name of Substance 2: EtOH 2 - Age of First Use: Unknown 2 - Amount (size/oz): 2 drinks 2 - Frequency: Pt states he uses infrequently 2 - Duration: Unknown 2 - Last Use / Amount: 05/24/17                Sleep: Poor  Appetite:  Good  Current Medications: Current Facility-Administered Medications  Medication Dose Route Frequency Provider Last Rate Last Dose  . acetaminophen (TYLENOL) tablet 650 mg  650 mg Oral Q6H PRN Laveda Abbe, NP      . alum & mag hydroxide-simeth (MAALOX/MYLANTA) 200-200-20 MG/5ML suspension 30 mL  30 mL Oral Q4H PRN Laveda Abbe, NP      . haloperidol (HALDOL) tablet 5 mg  5 mg Oral BID Micheal Likens, MD   5 mg at 05/31/17 1044  . hydrOXYzine (ATARAX/VISTARIL) tablet 50 mg  50 mg Oral Q6H PRN Micheal Likens, MD      . OLANZapine zydis (ZYPREXA) disintegrating tablet 10 mg  10 mg Oral Q8H PRN Micheal Likens, MD       And  . LORazepam (ATIVAN) tablet 1 mg  1 mg Oral PRN Micheal Likens, MD       And  . ziprasidone (GEODON) injection 20 mg  20 mg Intramuscular PRN Micheal Likens, MD      . magnesium hydroxide (MILK OF MAGNESIA) suspension 30 mL  30 mL Oral Daily PRN Laveda Abbe, NP      . traZODone (DESYREL) tablet 50 mg  50 mg Oral QHS PRN Laveda Abbe, NP        Lab Results: No results found for this or any previous visit (from the past 48 hour(s)).  Blood Alcohol level:  Lab Results  Component Value Date   ETH 153 (H) 05/28/2017   ETH <10 05/24/2017    Metabolic Disorder Labs: No results found for: HGBA1C, MPG No results found for: PROLACTIN No results found for: CHOL, TRIG, HDL, CHOLHDL, VLDL, LDLCALC  Physical Findings: AIMS: Facial and Oral Movements Muscles of Facial Expression: None, normal Lips and  Perioral Area: None, normal Jaw: None, normal Tongue: None, normal,Extremity Movements Upper (arms, wrists, hands, fingers): None, normal Lower (legs, knees, ankles, toes): None, normal, Trunk Movements Neck, shoulders, hips: None, normal, Overall Severity Severity of abnormal movements (highest score from questions above): None, normal Incapacitation due to abnormal movements: None, normal Patient's awareness of abnormal movements (rate only patient's report): No Awareness, Dental Status Current problems with teeth and/or dentures?: No Does patient usually wear dentures?: No  CIWA:    COWS:     Musculoskeletal: Strength & Muscle Tone: within normal limits Gait & Station: normal Patient leans: N/A  Psychiatric Specialty Exam: Physical Exam  Nursing note and vitals reviewed.   Review of Systems  Constitutional: Negative for chills and fever.  Respiratory: Negative for cough and shortness of breath.   Cardiovascular: Negative for chest pain.  Gastrointestinal: Negative for abdominal pain, heartburn, nausea and vomiting.  Psychiatric/Behavioral: Negative for depression, hallucinations and suicidal ideas. The patient is not nervous/anxious and does not have insomnia.     Blood pressure 112/87, pulse 87, temperature (!) 97.5 F (36.4 C), temperature source Oral, resp. rate 18, height  (1.626 m), weight 59.4 kg  (131 lb), SpO2 98 %.Body mass index is 22.49 kg/m.  General Appearance: Casual and Disheveled  Eye Contact:  Good  Speech:  Clear and Coherent and Normal Rate  Volume:  Normal  Mood:  Anxious and Irritable  Affect:  Blunt  Thought Process:  Disorganized and Descriptions of Associations: Loose  Orientation:  Full (Time, Place, and Person)  Thought Content:  Tangential  Suicidal Thoughts:  No  Homicidal Thoughts:  No  Memory:  Immediate;   Fair Recent;   Fair Remote;   Fair  Judgement:  Poor  Insight:  Lacking  Psychomotor Activity:  Normal  Concentration:  Concentration: Fair  Recall:  [employer] of Knowledge:  Fair  Language:  Fair  Akathisia:  No  Handed:    AIMS (if indicated):     Assets:  Resilience  ADL's:  Intact  Cognition:  WNL  Sleep:  Number of Hours: 2.25   Treatment Plan Summary: Daily contact with patient to assess and evaluate symptoms and progress in treatment and Medication management   -Continue inpatient hospitalization  -Schizophrenia, undifferentiated   -Change haldol  po BID to haldol  po (or IM if pt refuses) BID  -Anxiety   -Continue atarax  po q6h prn anxiety  -Insomnia   -Continue trazodone  po qhs prn insomnia  - Agitation  - Continue agitation protocol with zydis  po q8h prn agitation   - Continue Ativan  po one dose PRN agitation   - Continue geodon  IM once as needed for severe agitation   Micheal Likens, MD 06/02/2017, 12:48 PM

## 2017-06-02 NOTE — Progress Notes (Signed)
DAR NOTE: Patient appears disorganized with tangential thought process.  Denies suicidal thoughts, pain, auditory and visual hallucinations.  Described energy level as normal and concentration as good.  Rates depression at 1, hopelessness at 0, and anxiety at 0.  Maintained on routine safety checks.  Refused all prescribed medications after several attempts.    Support and encouragement offered as needed.  Attended group and participated.  Patient observed socializing with peers in the dayroom.  Offered no complaint.

## 2017-06-02 NOTE — Progress Notes (Signed)
Recreation Therapy Notes  Date: 4.29.19 Time: 1000 Location: [address] Dayroom  Group Topic: Coping Skills  Goal Area(s) Addresses:  Patient will be able to identify positive coping skills. Patient will be able to identify benefits of using coping skills. Patient will be able to identify benefit of using coping skills post d/c.  Behavioral Response: Engaged  Intervention: Pencils, worksheet  Activity: Coping Skills Mind Map.  LRT and patients filled in the first 8 boxes of the mind map (depression, blind ambition, struggle, finances, relationships, sickness, loss of a loved one and temptation) together.  Patients were then given time to come with at least 3 positive coping skills for each situation on their own before the group came back together.  Education: Pharmacologist, Building control surveyor.   Education Outcome: Acknowledges understanding/In group clarification offered/Needs additional education.   Clinical Observations/Feedback: Pt identified some of his coping skills as letting go, abstain, staying grounded, avoidance, know the problem, go for broke, separate and force yourself to enjoy things.   Caroll Rancher, LRT/CTRS         Caroll Rancher A 06/02/2017 12:49 PM

## 2017-06-02 NOTE — [facility] Group Notes (Signed)
[facility] Group Notes:  (Nursing/MHT/Case Management/Adjunct)  Date:  06/02/2017  Time:  9:05 AM  Type of Therapy:  orientation/goals  Participation Level:  Minimal  Participation Quality:  Appropriate  Affect:  Appropriate  Cognitive:  Appropriate  Insight:  Good  Engagement in Group:  Limited  Modes of Intervention:  Discussion  Summary of Progress/Problems: Pt stated,twice, that there was no goal for the day by saying he was "not sure" what to put down. Pt wanted to be engaged but had no input.   Jordan Gomez [name] 06/02/2017, 9:05 AM

## 2017-06-02 NOTE — Tx Team (Signed)
Interdisciplinary Treatment and Diagnostic Plan Update  06/02/2017 Time of Session: 4:09 PM  [name] MRN: [id number]  Principal Diagnosis: Schizophrenia, undifferentiated ([place])  Secondary Diagnoses: Principal Problem:   Schizophrenia, undifferentiated ([place])   Current Medications:  Current Facility-Administered Medications  Medication Dose Route Frequency Provider Last Rate Last Dose  . acetaminophen (TYLENOL) tablet 650 mg  650 mg Oral Q6H PRN Ethelene Hal, NP      . alum & mag hydroxide-simeth (MAALOX/MYLANTA) 200-200-20 MG/5ML suspension 30 mL  30 mL Oral Q4H PRN Ethelene Hal, NP      . haloperidol (HALDOL) tablet 5 mg  5 mg Oral BID Pennelope Bracken, MD       Or  . haloperidol lactate (HALDOL) injection 5 mg  5 mg Intramuscular BID Pennelope Bracken, MD      . hydrOXYzine (ATARAX/VISTARIL) tablet 50 mg  50 mg Oral Q6H PRN Pennelope Bracken, MD      . OLANZapine zydis (ZYPREXA) disintegrating tablet 10 mg  10 mg Oral Q8H PRN Pennelope Bracken, MD       And  . LORazepam (ATIVAN) tablet 1 mg  1 mg Oral PRN Pennelope Bracken, MD       And  . ziprasidone (GEODON) injection 20 mg  20 mg Intramuscular PRN Pennelope Bracken, MD      . magnesium hydroxide (MILK OF MAGNESIA) suspension 30 mL  30 mL Oral Daily PRN Ethelene Hal, NP      . traZODone (DESYREL) tablet 50 mg  50 mg Oral QHS PRN Ethelene Hal, NP        PTA Medications: Medications Prior to Admission  Medication Sig Dispense Refill Last Dose  . haloperidol (HALDOL) 2 MG tablet Take 1 tablet (2 mg total) by mouth 2 (two) times daily. 60 tablet 0 not started    Patient Stressors: Financial difficulties Medication change or noncompliance Substance abuse  Patient Strengths: Average or above average intelligence Physical Health  Treatment Modalities: Medication Management, Group therapy, Case management,  1 to 1 session with clinician,  Psychoeducation, Recreational therapy.   Physician Treatment Plan for Primary Diagnosis: Schizophrenia, undifferentiated ([place]) Long Term Goal(s): Improvement in symptoms so as ready for discharge  Short Term Goals: Compliance with prescribed medications will improve Ability to identify and develop effective coping behaviors will improve  Medication Management: Evaluate patient's response, side effects, and tolerance of medication regimen.  Therapeutic Interventions: 1 to 1 sessions, Unit Group sessions and Medication administration.  Evaluation of Outcomes: Progressing  Physician Treatment Plan for Secondary Diagnosis: Principal Problem:   Schizophrenia, undifferentiated ([place])   Long Term Goal(s): Improvement in symptoms so as ready for discharge  Short Term Goals: Compliance with prescribed medications will improve Ability to identify and develop effective coping behaviors will improve  Medication Management: Evaluate patient's response, side effects, and tolerance of medication regimen.  Therapeutic Interventions: 1 to 1 sessions, Unit Group sessions and Medication administration.  Evaluation of Outcomes: Progressing   RN Treatment Plan for Primary Diagnosis: Schizophrenia, undifferentiated ([place]) Long Term Goal(s): Knowledge of disease and therapeutic regimen to maintain health will improve  Short Term Goals: Compliance with prescribed medications will improve  Medication Management: RN will administer medications as ordered by provider, will assess and evaluate patient's response and provide education to patient for prescribed medication. RN will report any adverse and/or side effects to prescribing provider.  Therapeutic Interventions: 1 on 1 counseling sessions, Psychoeducation, Medication administration, Evaluate responses to treatment, Monitor vital  signs and CBGs as ordered, Perform/monitor CIWA, COWS, AIMS and Fall Risk screenings as ordered, Perform wound care  treatments as ordered.  Evaluation of Outcomes: Progressinge   LCSW Treatment Plan for Primary Diagnosis: Schizophrenia, undifferentiated ([place]) Long Term Goal(s): Safe transition to appropriate next level of care at discharge, Engage patient in therapeutic group addressing interpersonal concerns.  Short Term Goals: Engage patient in aftercare planning with referrals and resources  Therapeutic Interventions: Assess for all discharge needs, 1 to 1 time with Social worker, Explore available resources and support systems, Assess for adequacy in community support network, Educate family and significant other(s) on suicide prevention, Complete Psychosocial Assessment, Interpersonal group therapy.  Evaluation of Outcomes: Met  Return home, follow up [facility]   Progress in Treatment: Attending groups: Yes Participating in groups: Yes Taking medication as prescribed: Yes Toleration medication: Yes, no side effects reported at this time Family/Significant other contact made: No Patient understands diagnosis: No  Limited insight Discussing patient identified problems/goals with staff: Yes Medical problems stabilized or resolved: Yes Denies suicidal/homicidal ideation: Yes Issues/concerns per patient self-inventory: None Other: N/A  New problem(s) identified: None identified at this time.   New Short Term/Long Term Goal(s): "I'm feeling better."   Discharge Plan or Barriers:   Reason for Continuation of Hospitalization:  Hallucinations  Medication stabilization   Estimated Length of Stay: 5/3  Attendees: Patient: Jordan Gomez 06/02/2017  4:09 PM  Physician: Maris Berger, MD 06/02/2017  4:09 PM  Nursing: Sena Hitch, RN 06/02/2017  4:09 PM  RN Care Manager: Lars Pinks, RN 06/02/2017  4:09 PM  Social Worker: Ripley Fraise 06/02/2017  4:09 PM  Recreational Therapist: Winfield Cunas 06/02/2017  4:09 PM  Other: Norberto Sorenson 06/02/2017  4:09 PM  Other:  06/02/2017   4:09 PM    Scribe for Treatment Team:  Roque Lias LCSW 06/02/2017 4:09 PM

## 2017-06-02 NOTE — Progress Notes (Signed)
Pt has been observed most of the evening sitting in the dayroom watching TV and talking with other patients.  Pt has been appropriate with staff throughout the evening until writer approached him to do the evening assessment and talk to him about the forced med order that was completed this afternoon.  Pt then became irate and argumentative with Clinical research associate.  Pt was told of his options and that he would have to take the po med or receive the IM of Haldol 5 mg.  He continued to refuse the med.  Writer called for a show of support to administer the IM, but had the po med in hand in case he would take it.  One of the male MHT tried to speak with the pt alone and reason with him, but he continued to be argumentative.  After about 30 minutes, staff opened the door to give the IM because the pt continued to argue with the MHT, but when the pt saw the security officer, he reluctantly took the pill.  Staff stayed with the pt until it appeared he had swallowed the pill or it had melted on his tongue.  Pt then came out of his room and went to the dayroom to watch a basketball game, then went to his room to bed.  Pt denies SI/HI/AVH and says he does not need any medication.  Support and encouragement offered.  Discharge plans are in process.   Safety maintained with q15 minute checks.

## 2017-06-02 NOTE — Progress Notes (Signed)
Adult Psychoeducational Group Note  Date:  06/02/2017 Time:  6:45 PM  Group Topic/Focus:  Developing a Wellness Toolbox:   The focus of this group is to help patients develop a "wellness toolbox" with skills and strategies to promote recovery upon discharge.  Participation Level:  Active  Participation Quality:  Appropriate  Affect:  Appropriate  Cognitive:  Appropriate  Insight: Appropriate  Engagement in Group:  Engaged  Modes of Intervention:  Discussion  Additional Comments:  Pt was engaged in the group and gave solid feedback to his peers on what wellness looks like. He was eager to give feedback but not his own definition.   Jordan Gomez [name] 06/02/2017, 6:45 PM

## 2017-06-03 MED ORDER — BENZTROPINE MESYLATE 1 MG PO TABS
1.0000 mg | ORAL_TABLET | Freq: Two times a day (BID) | ORAL | Status: DC | PRN
  Filled 2017-06-03: qty 14

## 2017-06-03 NOTE — Progress Notes (Signed)
Recreation Therapy Notes  Date: 4.30.19 Time: 1000 Location: 500 Hall Dayroom  Group Topic: Self-Esteem  Goal Area(s) Addresses:  Patient will successfully identify positive attributes about themselves.  Patient will successfully identify benefit of improved self-esteem.   Behavioral Response: Engaged  Intervention: Magazines, colored pencils, markers, scissors, glue  Activity: Advertisement.  Patients were to create an advertisement highlighting their positive qualities.  Education:  Self-Esteem, Building control surveyor.   Education Outcome: Acknowledges education/In group clarification offered/Needs additional education  Clinical Observations/Feedback: Pt stated he hates it when "people try to force things on me because it gets me upset".  Pt also stated people focus on the negative because of "insecurities and depression".  Pt started working on his advertisement but eventually left early with doctor and did not return.    Caroll Rancher, LRT/CTRS         Caroll Rancher A 06/03/2017 11:47 AM

## 2017-06-03 NOTE — Progress Notes (Signed)
[facility] MD Progress Note  06/03/2017 1:07 PM Amon Bruington  MRN:  [id number] Subjective:    Jordan Gomez is a 27 y/o M with history of psychosis and recent history of admission to [facility] with discharge on 05/13/17 for psychosis who was admitted from [facility] on IVC after he was brought in by [employer] due to disorganized behaviors, agitation, and walking out into traffic. Pt was agitated in the ED and required IM geodon to manage his behaviors. Pt has recent history of multiple presentations to the ED due to mental health concerns, mostly with vague complaints and disorganized thought pattern. Pt was transferred to [facility] for additional treatment and stabilization. He was started on regimen of haldol at time of initial intake; however, he only took one dose, and had been refusing since that time. Yesterday, he continued to refuse all psychotropic medication, so second opinion for forced medications was obtained and completed by Dr. Jola Babinski (filed in paper chart). Pt's orders for haldol were amended to be given IM if he refuses oral form, and last evening after being faced with having to take IM version of medication, he accepted oral form of haldol.  Today upon evaluation, pt is mildly irritable and short with his responses. He remains circumstantial and vague with most of his responses. When asked how he is doing, pt replies, "decent." He has no specific concerns. He is sleeping well. His appetite is good. He denies SI/HI/AH/VH. When asked about his medications, pt continues to feel that he does not need them, stating, "I don't understand why I have to take anything." Discussed with patient about his multiple presentations to the hospital due to disorganized behaviors and previously refusing medications, so we would need to take a different approach during this hospitalization. Pt verbalized understanding but continued to disagree with the general notion of taking medications. However, he notes that he is  tolerating haldol well and has no side effects, and he feels that it has been helpful for him. When asked what has improved pt shares, "It just works - whatever it is doing, it is doing it well." Discussed with patient that we will continue forced medications at this time, and proposed alternative option of long-acting injectable form of antipsychotic. Pt shared that he would be willing to take oral form due to his dislike of needles. Pt had no further questions, comments, or concerns.  Principal Problem: Schizophrenia, undifferentiated (HCC) Diagnosis:   Patient Active Problem List   Diagnosis Date Noted  . 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 Acute psychosis (HCC) [F23] 05/08/2017   Total Time spent with patient: 30 minutes  Past Psychiatric History: see H&P  Past Medical History: History reviewed. No pertinent past medical history. History reviewed. No pertinent surgical history. Family History:  Family History  Family history unknown: Yes   Family Psychiatric  History: see H&P Social History:  Social History   Substance and Sexual Activity  Alcohol Use Yes  . Frequency: Never   Comment: seldom     Social History   Substance and Sexual Activity  Drug Use Yes  . Types: Marijuana   Comment: occasionally    Social History   Socioeconomic History  . Marital status: Single    Spouse name: Not on file  . Number of children: Not on file  . Years of education: Not on file  . Highest education level: Not on file  Occupational History  . Not on file  Social Needs  . Financial resource strain: Not  on file  . Food insecurity:    Worry: Not on file    Inability: Not on file  . Transportation needs:    Medical: Not on file    Non-medical: Not on file  Tobacco Use  . Smoking status: Former Smoker    Types: Cigarettes  . Smokeless tobacco: Never Used  . Tobacco comment: refused  Substance and Sexual Activity  . Alcohol use: Yes    Frequency: Never     Comment: seldom  . Drug use: Yes    Types: Marijuana    Comment: occasionally  . Sexual activity: Not Currently  Lifestyle  . Physical activity:    Days per week: Not on file    Minutes per session: Not on file  . Stress: Not on file  Relationships  . Social connections:    Talks on phone: Not on file    Gets together: Not on file    Attends religious service: Not on file    Active member of club or organization: Not on file    Attends meetings of clubs or organizations: Not on file    Relationship status: Not on file  Other Topics Concern  . Not on file  Social History Narrative  . Not on file   Additional Social History:    Pain Medications: Please see MAR Prescriptions: Please see MAR Over the Counter: Please see MAR History of alcohol / drug use?: Yes Longest period of sobriety (when/how long): Unknown Negative Consequences of Use: (denies) Withdrawal Symptoms: Other (Comment)(denies symptoms) Name of Substance 1: Marijuana 1 - Age of First Use: Unknown 1 - Amount (size/oz): Unknown 1 - Frequency: Pt states he uses infrequently 1 - Duration: Unknown 1 - Last Use / Amount: unknown Name of Substance 2: EtOH 2 - Age of First Use: Unknown 2 - Amount (size/oz): 2 drinks 2 - Frequency: Pt states he uses infrequently 2 - Duration: Unknown 2 - Last Use / Amount: 05/24/17                Sleep: Good  Appetite:  Good  Current Medications: Current Facility-Administered Medications  Medication Dose Route Frequency Provider Last Rate Last Dose  . acetaminophen (TYLENOL) tablet 650 mg  650 mg Oral Q6H PRN Laveda Abbe, NP      . alum & mag hydroxide-simeth (MAALOX/MYLANTA) 200-200-20 MG/5ML suspension 30 mL  30 mL Oral Q4H PRN Laveda Abbe, NP      . haloperidol (HALDOL) tablet 5 mg  5 mg Oral BID Micheal Likens, MD   5 mg at 06/03/17 1037   Or  . haloperidol lactate (HALDOL) injection 5 mg  5 mg Intramuscular BID Jolyne Loa T, MD      . hydrOXYzine (ATARAX/VISTARIL) tablet 50 mg  50 mg Oral Q6H PRN Micheal Likens, MD      . OLANZapine zydis (ZYPREXA) disintegrating tablet 10 mg  10 mg Oral Q8H PRN Micheal Likens, MD       And  . LORazepam (ATIVAN) tablet 1 mg  1 mg Oral PRN Micheal Likens, MD       And  . ziprasidone (GEODON) injection 20 mg  20 mg Intramuscular PRN Micheal Likens, MD      . magnesium hydroxide (MILK OF MAGNESIA) suspension 30 mL  30 mL Oral Daily PRN Laveda Abbe, NP      . traZODone (DESYREL) tablet 50 mg  50 mg Oral QHS PRN Laveda Abbe, NP  Lab Results: No results found for this or any previous visit (from the past 48 hour(s)).  Blood Alcohol level:  Lab Results  Component Value Date   ETH 153 (H) 05/28/2017   ETH <10 05/24/2017    Metabolic Disorder Labs: No results found for: HGBA1C, MPG No results found for: PROLACTIN No results found for: CHOL, TRIG, HDL, CHOLHDL, VLDL, LDLCALC  Physical Findings: AIMS: Facial and Oral Movements Muscles of Facial Expression: None, normal Lips and Perioral Area: None, normal Jaw: None, normal Tongue: None, normal,Extremity Movements Upper (arms, wrists, hands, fingers): None, normal Lower (legs, knees, ankles, toes): None, normal, Trunk Movements Neck, shoulders, hips: None, normal, Overall Severity Severity of abnormal movements (highest score from questions above): None, normal Incapacitation due to abnormal movements: None, normal Patient's awareness of abnormal movements (rate only patient's report): No Awareness, Dental Status Current problems with teeth and/or dentures?: No Does patient usually wear dentures?: No  CIWA:    COWS:     Musculoskeletal: Strength & Muscle Tone: within normal limits Gait & Station: normal Patient leans: N/A  Psychiatric Specialty Exam: Physical Exam  Nursing note and vitals reviewed.   Review of Systems  Constitutional:  Negative for chills and fever.  Respiratory: Negative for cough and shortness of breath.   Cardiovascular: Negative for chest pain.  Gastrointestinal: Negative for abdominal pain, heartburn, nausea and vomiting.  Psychiatric/Behavioral: Negative for depression, hallucinations and suicidal ideas. The patient is not nervous/anxious and does not have insomnia.     Blood pressure (!) 133/106, pulse 94, temperature (!) 97.4 F (36.3 C), temperature source Oral, resp. rate 16, height  (1.626 m), weight 59.4 kg (131 lb), SpO2 98 %.Body mass index is 22.49 kg/m.  General Appearance: Disheveled  Eye Contact:  Fair  Speech:  Clear and Coherent and Normal Rate  Volume:  Normal  Mood:  Depressed and Irritable  Affect:  Congruent and Flat  Thought Process:  Disorganized and Goal Directed  Orientation:  Full (Time, Place, and Person)  Thought Content:  circumstantiality, denies AH/VH  Suicidal Thoughts:  No  Homicidal Thoughts:  No  Memory:  Immediate;   Fair Recent;   Fair Remote;   Fair  Judgement:  Poor  Insight:  Lacking  Psychomotor Activity:  Normal  Concentration:  Concentration: Fair  Recall:  [employer] of Knowledge:  Fair  Language:  Fair  Akathisia:  No  Handed:    AIMS (if indicated):     Assets:  Resilience  ADL's:  Intact  Cognition:  WNL  Sleep:  Number of Hours: 5.75   Treatment Plan Summary: Daily contact with patient to assess and evaluate symptoms and progress in treatment and Medication management   -Continue inpatient hospitalization  -Schizophrenia, undifferentiated             -Continue haldol  po (or IM if pt refuses) BID  -Anxiety              -Continue atarax  po q6h prn anxiety  -Insomnia             -Continue trazodone  po qhs prn insomnia  -EPS   -Continue cogentin  po BID prn EPS  - Agitation             - Continue agitation protocol with zydis  po q8h prn agitation             - Continue Ativan  po one dose PRN  agitation             -  Continue geodon  IM once as needed for severe agitation   Micheal Likens, MD 06/03/2017, 1:07 PM

## 2017-06-03 NOTE — Progress Notes (Signed)
Adult Psychoeducational Group Note  Date:  06/03/2017 Time:  8:26 PM  Group Topic/Focus:  Wrap-Up Group:   The focus of this group is to help patients review their daily goal of treatment and discuss progress on daily workbooks.  Participation Level:  Active  Participation Quality:  Appropriate  Affect:  Appropriate  Cognitive:  Appropriate  Insight: Appropriate  Engagement in Group:  Engaged  Modes of Intervention:  Discussion  Additional Comments: The patient expressed that he attended groups.The patient also said that he was ready for discharge.  Octavio Manns 06/03/2017, 8:26 PM

## 2017-06-03 NOTE — [facility] Group Notes (Signed)
[facility] LCSW Group Therapy 06/03/2017 1:15pm  Type of Therapy: Group Therapy- Feelings Around Discharge & Establishing a Supportive Framework  Participation Level:  Active  Description of Group:   What is a supportive framework? What does it look like feel like and how do I discern it from and unhealthy non-supportive network? Learn how to cope when supports are not helpful and don't support you. Discuss what to do when your family/friends are not supportive.  Summary of Patient Progress  Stayed the entire time, actively engaged throughout.  Circumstantial and vague throughout, and whenever I tried to summarize what he was saying he disagreed with my summary.  Identified no supports.   Therapeutic Modalities:   Cognitive Behavioral Therapy Person-Centered Therapy Motivational Interviewing   Ida Rogue, [place] 06/03/2017 3:35 PM

## 2017-06-03 NOTE — Plan of Care (Signed)
  Problem: Safety: Goal: Periods of time without injury will increase Outcome: Progressing   Problem: Medication: Goal: Compliance with prescribed medication regimen will improve Outcome: Progressing  DAR NOTE: Patient presents with calm affect and mood.  Denies pain, auditory and visual hallucinations.  Rates depression at 1, hopelessness at 0, and anxiety at 0.  Maintained on routine safety checks.  Medications given as prescribed.  Support and encouragement offered as needed.  Attended group and participated.  States goal for today is "on what matters."  Patient visible in milieu not interacting with peers.  Offered no complaint.

## 2017-06-04 MED ORDER — HALOPERIDOL DECANOATE 100 MG/ML IM SOLN
100.0000 mg | INTRAMUSCULAR | Status: DC
  Administered 2017-06-04: 100 mg via INTRAMUSCULAR
  Filled 2017-06-04 (×2): qty 1

## 2017-06-04 NOTE — Progress Notes (Signed)
Patient ID: Jordan Gomez, male   DOB: 1990-03-27, 27 y.o.   MRN: [id number] DAR Note: Pt at the time of assessment denied any anxiety, depression, pain or AVH; "there is really no reason for me to be here." Pt was med compliant; inspected medication for some time before taking. All patient's questions and concerns addressed. Support, encouragement, and safe environment provided. 15-minute safety checks continue. Pt attended wrap-up group.

## 2017-06-04 NOTE — Progress Notes (Signed)
Patient denies SI, HI and AVH this shift.  Patient care for hygiene this shift and was able to take the long acting haldol deconate without incident.  Patient has had no incidents of behavioral dyscontrol this shift.   Assess patient for safety, offer medications as prescribed, engage patient in 1:1 staff talks.   Continue to monitor as planned.  Patent able to contract for safety.

## 2017-06-04 NOTE — Progress Notes (Signed)
Recreation Therapy Notes  Date: 5.1.19 Time: 1000 Location: 500 Hall Dayroom  Group Topic: Anxiety  Goal Area(s) Addresses:  Patient will identify triggers for anxiety.  Patient will verbalize symptoms of anxiety. Patient will identify coping skill for anxiety post d/c.  Behavioral Response: Engaged  Intervention: Worksheet, pencils  Activity: Anxiety.  Patients were given a worksheet to identify triggers, symptoms, thoughts and coping skills related to anxiety.  Education: Communication, Discharge Planning  Education Outcome: Acknowledges understanding/In group clarification offered/Needs additional education.   Clinical Observations/Feedback: Pt stated how anxiety shows up in people "varies from person to person".  Pt expressed some triggers to anxiety are bad blood, PTSD and sometimes the people you care about cause anxiety.  One of the symptoms he identified was "feelings that show".  One of the thoughts that goes through his head is "what is wrong?"  Pt stated his three coping skills were deep slow breaths, walk away and come together.    Caroll Rancher, LRT/CTRS     Caroll Rancher A 06/04/2017 11:41 AM

## 2017-06-04 NOTE — Progress Notes (Signed)
[facility] MD Progress Note  06/04/2017 3:20 PM Jordan Gomez  MRN:  [id number]  Subjective: Jordan Gomez reports, "I'm doing fine. I hope I'm sleeping fine. I don't know why we have to discuss medicine because I have already said that I don't need medicine. Why are you guys not listening to me? I have no reason to take medicines. I'm doing just fine".   Jordan "Jordan Gomez" Gomez is a 27 y/o M with history of psychosis and recent history of admission to [facility] with discharge on 05/13/17 for psychosis who was admitted from [facility] on IVC after he was brought in by [employer] due to disorganized behaviors, agitation, and walking out into traffic. Pt was agitated in the ED and required IM geodon to manage his behaviors. Pt has recent history of multiple presentations to the ED due to mental health concerns, mostly with vague complaints and disorganized thought pattern. Pt was transferred to [facility] for additional treatment and stabilization. He was started on regimen of haldol at time of initial intake; however, he only took one dose, and had been refusing since that time. Yesterday, he continued to refuse all psychotropic medication, so second opinion for forced medications was obtained and completed by Dr. Jola Babinski (filed in paper chart). Pt's orders for haldol were amended to be given IM if he refuses oral form, and last evening after being faced with having to take IM version of medication, he accepted oral form of haldol.  Today 06-04-17, upon evaluation, pt is mildly irritable and short with his responses. He remains circumstantial and vague with most of his responses. When asked how he is doing, pt replies, "I'm fine & I hope I'm sleeping fine." He has no specific concerns. He is sleeping well. His appetite is good. He denies SI/HI/AH/VH. When asked about his medications, pt continues to feel that he does not need them, stating, "I don't understand why I have to take anything." The attending psychiatrist had discussed with patient  earlier about his multiple presentations to the hospital due to disorganized behaviors and previously refusing medications, so we would need to take a different approach during this hospitalization. Pt verbalized understanding but continued to disagree with the general notion of taking medications. However, he notes that he is tolerating haldol well and has no side effects, and he feels that it has been helpful for him. When asked what has improved pt shares, "It just works - whatever it is doing, it is doing it well." Discussed with patient that we will continue forced medications at this time, and proposed alternative option of long-acting injectable form of antipsychotic. Pt shared that he would be willing to take oral form due to his dislike of needles. The haldol decanoate 100 mg IM is on board to be administered.  Pt had no further questions, comments, or concerns.  Principal Problem: Schizophrenia, undifferentiated (HCC) Diagnosis:   Patient Active Problem List   Diagnosis Date Noted  . 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 Acute psychosis (HCC) [F23] 05/08/2017   Total Time spent with patient: 15 minutes  Past Psychiatric History: see H&P  Past Medical History: History reviewed. No pertinent past medical history. History reviewed. No pertinent surgical history. Family History:  Family History  Family history unknown: Yes   Family Psychiatric  History: see H&P Social History:  Social History   Substance and Sexual Activity  Alcohol Use Yes  . Frequency: Never   Comment: seldom     Social History   Substance and Sexual Activity  Drug Use Yes  . Types: Marijuana   Comment: occasionally    Social History   Socioeconomic History  . Marital status: Single    Spouse name: Not on file  . Number of children: Not on file  . Years of education: Not on file  . Highest education level: Not on file  Occupational History  . Not on file  Social Needs  .  Financial resource strain: Not on file  . Food insecurity:    Worry: Not on file    Inability: Not on file  . Transportation needs:    Medical: Not on file    Non-medical: Not on file  Tobacco Use  . Smoking status: Former Smoker    Types: Cigarettes  . Smokeless tobacco: Never Used  . Tobacco comment: refused  Substance and Sexual Activity  . Alcohol use: Yes    Frequency: Never    Comment: seldom  . Drug use: Yes    Types: Marijuana    Comment: occasionally  . Sexual activity: Not Currently  Lifestyle  . Physical activity:    Days per week: Not on file    Minutes per session: Not on file  . Stress: Not on file  Relationships  . Social connections:    Talks on phone: Not on file    Gets together: Not on file    Attends religious service: Not on file    Active member of club or organization: Not on file    Attends meetings of clubs or organizations: Not on file    Relationship status: Not on file  Other Topics Concern  . Not on file  Social History Narrative  . Not on file   Additional Social History:    Pain Medications: Please see MAR Prescriptions: Please see MAR Over the Counter: Please see MAR History of alcohol / drug use?: Yes Longest period of sobriety (when/how long): Unknown Negative Consequences of Use: (denies) Withdrawal Symptoms: Other (Comment)(denies symptoms) Name of Substance 1: Marijuana 1 - Age of First Use: Unknown 1 - Amount (size/oz): Unknown 1 - Frequency: Pt states he uses infrequently 1 - Duration: Unknown 1 - Last Use / Amount: unknown Name of Substance 2: EtOH 2 - Age of First Use: Unknown 2 - Amount (size/oz): 2 drinks 2 - Frequency: Pt states he uses infrequently 2 - Duration: Unknown 2 - Last Use / Amount: 05/24/17  Sleep: Good  Appetite:  Good  Current Medications: Current Facility-Administered Medications  Medication Dose Route Frequency Provider Last Rate Last Dose  . acetaminophen (TYLENOL) tablet 650 mg  650 mg Oral  Q6H PRN Laveda Abbe, NP      . alum & mag hydroxide-simeth (MAALOX/MYLANTA) 200-200-20 MG/5ML suspension 30 mL  30 mL Oral Q4H PRN Laveda Abbe, NP      . benztropine (COGENTIN) tablet 1 mg  1 mg Oral BID PRN Micheal Likens, MD      . haloperidol (HALDOL) tablet 5 mg  5 mg Oral BID Micheal Likens, MD   5 mg at 06/04/17 1610   Or  . haloperidol lactate (HALDOL) injection 5 mg  5 mg Intramuscular BID Jolyne Loa T, MD      . haloperidol decanoate (HALDOL DECANOATE) 100 MG/ML injection 100 mg  100 mg Intramuscular Q30 days Jolyne Loa T, MD      . hydrOXYzine (ATARAX/VISTARIL) tablet 50 mg  50 mg Oral Q6H PRN Micheal Likens, MD   50 mg at 06/03/17 2019  .  OLANZapine zydis (ZYPREXA) disintegrating tablet 10 mg  10 mg Oral Q8H PRN Micheal Likens, MD       And  . LORazepam (ATIVAN) tablet 1 mg  1 mg Oral PRN Micheal Likens, MD       And  . ziprasidone (GEODON) injection 20 mg  20 mg Intramuscular PRN Micheal Likens, MD      . magnesium hydroxide (MILK OF MAGNESIA) suspension 30 mL  30 mL Oral Daily PRN Laveda Abbe, NP      . traZODone (DESYREL) tablet 50 mg  50 mg Oral QHS PRN Laveda Abbe, NP   50 mg at 06/03/17 2019   Lab Results: No results found for this or any previous visit (from the past 48 hour(s)).  Blood Alcohol level:  Lab Results  Component Value Date   ETH 153 (H) 05/28/2017   ETH <10 05/24/2017    Metabolic Disorder Labs: No results found for: HGBA1C, MPG No results found for: PROLACTIN No results found for: CHOL, TRIG, HDL, CHOLHDL, VLDL, LDLCALC  Physical Findings: AIMS: Facial and Oral Movements Muscles of Facial Expression: None, normal Lips and Perioral Area: None, normal Jaw: None, normal Tongue: None, normal,Extremity Movements Upper (arms, wrists, hands, fingers): None, normal Lower (legs, knees, ankles, toes): None, normal, Trunk  Movements Neck, shoulders, hips: None, normal, Overall Severity Severity of abnormal movements (highest score from questions above): None, normal Incapacitation due to abnormal movements: None, normal Patient's awareness of abnormal movements (rate only patient's report): No Awareness, Dental Status Current problems with teeth and/or dentures?: No Does patient usually wear dentures?: No  CIWA:    COWS:     Musculoskeletal: Strength & Muscle Tone: within normal limits Gait & Station: normal Patient leans: N/A  Psychiatric Specialty Exam: Physical Exam  Nursing note and vitals reviewed.   Review of Systems  Constitutional: Negative for chills and fever.  Respiratory: Negative for cough and shortness of breath.   Cardiovascular: Negative for chest pain.  Gastrointestinal: Negative for abdominal pain, heartburn, nausea and vomiting.  Psychiatric/Behavioral: Negative for depression, hallucinations and suicidal ideas. The patient is not nervous/anxious and does not have insomnia.     Blood pressure 100/71, pulse 73, temperature (!) 97.5 F (36.4 C), temperature source Oral, resp. rate 16, height  (1.626 m), weight 59.4 kg (131 lb), SpO2 98 %.Body mass index is 22.49 kg/m.  General Appearance: Disheveled  Eye Contact:  Fair  Speech:  Clear and Coherent and Normal Rate  Volume:  Normal  Mood:  Depressed and Irritable  Affect:  Congruent and Flat  Thought Process:  Disorganized and Goal Directed  Orientation:  Full (Time, Place, and Person)  Thought Content:  circumstantiality, denies AH/VH  Suicidal Thoughts:  No  Homicidal Thoughts:  No  Memory:  Immediate;   Fair Recent;   Fair Remote;   Fair  Judgement:  Poor  Insight:  Lacking  Psychomotor Activity:  Normal  Concentration:  Concentration: Fair  Recall:  [employer] of Knowledge:  Fair  Language:  Fair  Akathisia:  No  Handed:    AIMS (if indicated):     Assets:  Resilience  ADL's:  Intact  Cognition:  WNL   Sleep:  Number of Hours: 6.75   Treatment Plan Summary: Daily contact with patient to assess and evaluate symptoms and progress in treatment and Medication management   -Continue inpatient hospitalization.  -Will continue today 06/04/2017 plan as below except where it is noted.  -Schizophrenia,  undifferentiated             -Continue haldol  po (or IM if pt refuses) BID.             -Initiated Haldol Decanoate 100 mg IM to be administered today 06-04-17.  -Anxiety              -Continue atarax  po q6h prn anxiety  -Insomnia             -Continue trazodone  po qhs prn insomnia  -EPS   -Continue cogentin  po BID prn EPS  - Agitation             - Continue agitation protocol with zydis  po q8h prn agitation             - Continue Ativan  po one dose PRN agitation             - Continue geodon  IM once as needed for severe agitation   Armandina Stammer, NP, PMHNP, FNP-BC 06/04/2017, 3:20 PMPatient ID: Romie Jumper, male   DOB: 02/09/1990, 27 y.o.   MRN: [id number]

## 2017-06-04 NOTE — Plan of Care (Signed)
D: Pt denies SI/HI/AVH. Pt is pleasant and cooperative. Pt visible on the unit , pt stated he was feeling better  A: Pt was offered support and encouragement. Pt was given scheduled medications. Pt was encourage to attend groups. Q 15 minute checks were done for safety.   R:Pt attends groups and interacts well with peers and staff. Pt is taking medication. Pt has no complaints.Pt receptive to treatment and safety maintained on unit.   Problem: Activity: Goal: Sleeping patterns will improve Outcome: Progressing   Problem: Coping: Goal: Ability to verbalize frustrations and anger appropriately will improve Outcome: Progressing   Problem: Medication: Goal: Compliance with prescribed medication regimen will improve Outcome: Progressing

## 2017-06-04 NOTE — BHH Group Notes (Signed)
LCSW Group Therapy Note  06/04/2017 1:15pm  Type of Therapy/Topic:  Group Therapy:  Balance in Life  Participation Level:  None  Description of Group:    This group will address the concept of balance and how it feels and looks when one is unbalanced. Patients will be encouraged to process areas in their lives that are out of balance and identify reasons for remaining unbalanced. Facilitators will guide patients in utilizing problem-solving interventions to address and correct the stressor making their life unbalanced. Understanding and applying boundaries will be explored and addressed for obtaining and maintaining a balanced life. Patients will be encouraged to explore ways to assertively make their unbalanced needs known to significant others in their lives, using other group members and facilitator for support and feedback.  Therapeutic Goals: 1. Patient will identify two or more emotions or situations they have that consume much of in their lives. 2. Patient will identify signs/triggers that life has become out of balance:  3. Patient will identify two ways to set boundaries in order to achieve balance in their lives:  4. Patient will demonstrate ability to communicate their needs through discussion and/or role plays  Summary of Patient Progress:  Stayed the entire time, but seemed to be entranced in his own world. "I'm just trying to breathe." No meaningful contributions to the conversation.    Therapeutic Modalities:   Cognitive Behavioral Therapy Solution-Focused Therapy Assertiveness Training  Ida Rogue, [place] 06/04/2017 3:45 PM

## 2017-06-05 DIAGNOSIS — R451 Restlessness and agitation: Secondary | ICD-10-CM

## 2017-06-05 DIAGNOSIS — G4700 Insomnia, unspecified: Secondary | ICD-10-CM

## 2017-06-05 DIAGNOSIS — Z79899 Other long term (current) drug therapy: Secondary | ICD-10-CM

## 2017-06-05 DIAGNOSIS — Z87891 Personal history of nicotine dependence: Secondary | ICD-10-CM

## 2017-06-05 NOTE — Progress Notes (Signed)
[facility] MD Progress Note  06/05/2017 2:26 PM Jordan Gomez  MRN:  [id number]  Subjective: Jordan Gomez reports, "I guess I'm fine. I don't know how to tell if I'm fine any more. I'm not sure If I'm having any side effects. I know I felt some pain to my right shoulder when they gave me that shot yesterday. I guess I'm feeling fine today".  Jordan Gomez is a 27 y/o M with history of psychosis and recent history of admission to [facility] with discharge on 05/13/17 for psychosis who was admitted from [facility] on IVC after he was brought in by [employer] due to disorganized behaviors, agitation, and walking out into traffic. Pt was agitated in the ED and required IM geodon to manage his behaviors. Pt has recent history of multiple presentations to the ED due to mental health concerns, mostly with vague complaints and disorganized thought pattern. Pt was transferred to [facility] for additional treatment and stabilization. He was started on regimen of haldol at time of initial intake; however, he only took one dose, and had been refusing since that time. Yesterday, he continued to refuse all psychotropic medication, so second opinion for forced medications was obtained and completed by Dr. Jola Babinski (filed in paper chart). Pt's orders for haldol were amended to be given IM if he refuses oral form, and last evening after being faced with having to take IM version of medication, he accepted oral form of haldol.  Today 06-05-17, upon evaluation, pt presents quiet, lying down in his bed with a restricted affect. He remains circumstantial and vague with most of his responses. When asked how he is doing, pt replies, I guess I'm fine. I don't know how to tell if I'm fine any more". He has no specific concerns. He is sleeping well. His appetite is good. He denies SI/HI/AH/VH. He received his injectable form of antipsychotic yesterday. He denies any adverse effects or reactions. He is visible on the unit. Pt had no further questions, comments,  or concerns. He agreed to continue his current plan of care already in progress.  Principal Problem: Schizophrenia, undifferentiated (HCC) Diagnosis:   Patient Active Problem List   Diagnosis Date Noted  . 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 Acute psychosis (HCC) [F23] 05/08/2017   Total Time spent with patient: 15 minutes  Past Psychiatric History: see H&P  Past Medical History: History reviewed. No pertinent past medical history. History reviewed. No pertinent surgical history. Family History:  Family History  Family history unknown: Yes   Family Psychiatric  History: see H&P Social History:  Social History   Substance and Sexual Activity  Alcohol Use Yes  . Frequency: Never   Comment: seldom     Social History   Substance and Sexual Activity  Drug Use Yes  . Types: Marijuana   Comment: occasionally    Social History   Socioeconomic History  . Marital status: Single    Spouse name: Not on file  . Number of children: Not on file  . Years of education: Not on file  . Highest education level: Not on file  Occupational History  . Not on file  Social Needs  . Financial resource strain: Not on file  . Food insecurity:    Worry: Not on file    Inability: Not on file  . Transportation needs:    Medical: Not on file    Non-medical: Not on file  Tobacco Use  . Smoking status: Former Smoker    Types: Cigarettes  .  Smokeless tobacco: Never Used  . Tobacco comment: refused  Substance and Sexual Activity  . Alcohol use: Yes    Frequency: Never    Comment: seldom  . Drug use: Yes    Types: Marijuana    Comment: occasionally  . Sexual activity: Not Currently  Lifestyle  . Physical activity:    Days per week: Not on file    Minutes per session: Not on file  . Stress: Not on file  Relationships  . Social connections:    Talks on phone: Not on file    Gets together: Not on file    Attends religious service: Not on file    Active member  of club or organization: Not on file    Attends meetings of clubs or organizations: Not on file    Relationship status: Not on file  Other Topics Concern  . Not on file  Social History Narrative  . Not on file   Additional Social History:    Pain Medications: Please see MAR Prescriptions: Please see MAR Over the Counter: Please see MAR History of alcohol / drug use?: Yes Longest period of sobriety (when/how long): Unknown Negative Consequences of Use: (denies) Withdrawal Symptoms: Other (Comment)(denies symptoms) Name of Substance 1: Marijuana 1 - Age of First Use: Unknown 1 - Amount (size/oz): Unknown 1 - Frequency: Pt states he uses infrequently 1 - Duration: Unknown 1 - Last Use / Amount: unknown Name of Substance 2: EtOH 2 - Age of First Use: Unknown 2 - Amount (size/oz): 2 drinks 2 - Frequency: Pt states he uses infrequently 2 - Duration: Unknown 2 - Last Use / Amount: 05/24/17  Sleep: Good  Appetite:  Good  Current Medications: Current Facility-Administered Medications  Medication Dose Route Frequency Provider Last Rate Last Dose  . acetaminophen (TYLENOL) tablet 650 mg  650 mg Oral Q6H PRN Laveda Abbe, NP      . alum & mag hydroxide-simeth (MAALOX/MYLANTA) 200-200-20 MG/5ML suspension 30 mL  30 mL Oral Q4H PRN Laveda Abbe, NP      . benztropine (COGENTIN) tablet 1 mg  1 mg Oral BID PRN Micheal Likens, MD      . haloperidol (HALDOL) tablet 5 mg  5 mg Oral BID Micheal Likens, MD   5 mg at 06/05/17 1610   Or  . haloperidol lactate (HALDOL) injection 5 mg  5 mg Intramuscular BID Jolyne Loa T, MD      . haloperidol decanoate (HALDOL DECANOATE) 100 MG/ML injection 100 mg  100 mg Intramuscular Q30 days Jolyne Loa T, MD   100 mg at 06/04/17 1646  . hydrOXYzine (ATARAX/VISTARIL) tablet 50 mg  50 mg Oral Q6H PRN Micheal Likens, MD   50 mg at 06/03/17 2019  . OLANZapine zydis (ZYPREXA) disintegrating  tablet 10 mg  10 mg Oral Q8H PRN Micheal Likens, MD       And  . LORazepam (ATIVAN) tablet 1 mg  1 mg Oral PRN Micheal Likens, MD       And  . ziprasidone (GEODON) injection 20 mg  20 mg Intramuscular PRN Micheal Likens, MD      . magnesium hydroxide (MILK OF MAGNESIA) suspension 30 mL  30 mL Oral Daily PRN Laveda Abbe, NP      . traZODone (DESYREL) tablet 50 mg  50 mg Oral QHS PRN Laveda Abbe, NP   50 mg at 06/04/17 2105   Lab Results: No results found for this  or any previous visit (from the past 48 hour(s)).  Blood Alcohol level:  Lab Results  Component Value Date   ETH 153 (H) 05/28/2017   ETH <10 05/24/2017    Metabolic Disorder Labs: No results found for: HGBA1C, MPG No results found for: PROLACTIN No results found for: CHOL, TRIG, HDL, CHOLHDL, VLDL, LDLCALC  Physical Findings: AIMS: Facial and Oral Movements Muscles of Facial Expression: None, normal Lips and Perioral Area: None, normal Jaw: None, normal Tongue: None, normal,Extremity Movements Upper (arms, wrists, hands, fingers): None, normal Lower (legs, knees, ankles, toes): None, normal, Trunk Movements Neck, shoulders, hips: None, normal, Overall Severity Severity of abnormal movements (highest score from questions above): None, normal Incapacitation due to abnormal movements: None, normal Patient's awareness of abnormal movements (rate only patient's report): No Awareness, Dental Status Current problems with teeth and/or dentures?: No Does patient usually wear dentures?: No  CIWA:    COWS:     Musculoskeletal: Strength & Muscle Tone: within normal limits Gait & Station: normal Patient leans: N/A  Psychiatric Specialty Exam: Physical Exam  Nursing note and vitals reviewed.   Review of Systems  Constitutional: Negative for chills and fever.  Respiratory: Negative for cough and shortness of breath.   Cardiovascular: Negative for chest pain.   Gastrointestinal: Negative for abdominal pain, heartburn, nausea and vomiting.  Psychiatric/Behavioral: Negative for depression, hallucinations and suicidal ideas. The patient is not nervous/anxious and does not have insomnia.     Blood pressure 107/69, pulse 93, temperature 98.4 F (36.9 C), temperature source Oral, resp. rate 16, height  (1.626 m), weight 59.4 kg (131 lb), SpO2 98 %.Body mass index is 22.49 kg/m.  General Appearance: Disheveled  Eye Contact:  Fair  Speech:  Clear and Coherent and Normal Rate  Volume:  Normal  Mood:  Depressed and Irritable  Affect:  Congruent and Flat  Thought Process:  Disorganized and Goal Directed  Orientation:  Full (Time, Place, and Person)  Thought Content:  circumstantiality, denies AH/VH  Suicidal Thoughts:  No  Homicidal Thoughts:  No  Memory:  Immediate;   Fair Recent;   Fair Remote;   Fair  Judgement:  Poor  Insight:  Lacking  Psychomotor Activity:  Normal  Concentration:  Concentration: Fair  Recall:  [employer] of Knowledge:  Fair  Language:  Fair  Akathisia:  No  Handed:    AIMS (if indicated):     Assets:  Resilience  ADL's:  Intact  Cognition:  WNL  Sleep:  Number of Hours: 6.75   Treatment Plan Summary: Daily contact with patient to assess and evaluate symptoms and progress in treatment and Medication management   -Continue inpatient hospitalization.  -Will continue today 06/05/2017 plan as below except where it is noted.  -Schizophrenia, undifferentiated             -Continue haldol  po (or IM if pt refuses) BID.             -Received Haldol Decanoate 100 mg IM, administered on 06-04-17.  -Anxiety              -Continue atarax  po q6h prn anxiety  -Insomnia             -Continue trazodone  po qhs prn insomnia  -EPS   -Continue cogentin  po BID prn EPS  - Agitation             - Continue agitation protocol with zydis  po q8h prn agitation             -  Continue Ativan  po one  dose PRN agitation             - Continue geodon  IM once as needed for severe agitation   Armandina Stammer, NP, PMHNP, FNP-BC 06/05/2017, 2:26 PMPatient ID: Jordan Gomez, male   DOB: Jun 02, 1990, 27 y.o.   MRN: [id number]

## 2017-06-05 NOTE — BHH Group Notes (Signed)
[facility] [employer] Group Therapy  06/05/2017 , 1:30 PM    Type of Therapy:  [employer] Presentation  Participation Level:  Active  Participation Quality:  Attentive  Affect:  Blunted  Cognitive:  Oriented  Insight:  Limited  Engagement in Therapy:  Engaged  Modes of Intervention:  Discussion, Education and Socialization  Summary of Progress/Problems:  Jordan Gomez  from [employer] came to present her recovery story, encourage group  members to share something about their story, and present information about the [employer].   In and out several times, appeared distracted when present. Never stayed for more that a minute or two.  Daryel Gerald B 06/05/2017 , 1:30 PM

## 2017-06-05 NOTE — Progress Notes (Signed)
Patient denies SI, HI and AVH this shift.  Patient cared for hygiene this shift   Patient has had no incidents of behavioral dyscontrol this shift.   Assess patient for safety, offer medications as prescribed, engage patient in 1:1 staff talks.   Continue to monitor as planned.  Patent able to contract for safety.

## 2017-06-05 NOTE — Progress Notes (Signed)
Recreation Therapy Notes  Date: 5.2.19 Time: 0950 Location: 500 Hall  Group Topic: Support Systems  Goal Area(s) Addresses:  Patient will be able to identify the benefits of healthy support systems. Patient will be able to identify the positive effects of support systems. Patient will be able to identify how support systems can be used post d/c.  Behavioral Response: Engaged  Intervention: Veterinary surgeon  Activity: [employer] in [employer].  Each patient was given a rubber disc and the group was one disc more than the number of people in the group.  Patients were to use the discs to maneuver the group from the nurses station to the end of hall and back.  Education: Communication, Discharge Planning  Education Outcome: Acknowledges understanding/In group clarification offered/Needs additional education.   Clinical Observations/Feedback: Pt expressed that support systems are made up of people who mean something to you.  Pt also stated support systems can be family.  Pt was the head of the line for the group.  Pt made the decision of how far to throw out the discs.  Pt made adjustments when he made aware he was placing the discs too far out.  Pt expressed the group used communication and teamwork to complete the task.    Caroll Rancher, LRT/CTRS      Lillia Abed, Zyair Russi A 06/05/2017 11:12 AM

## 2017-06-06 DIAGNOSIS — F23 Brief psychotic disorder: Secondary | ICD-10-CM

## 2017-06-06 DIAGNOSIS — F1290 Cannabis use, unspecified, uncomplicated: Secondary | ICD-10-CM

## 2017-06-06 MED ORDER — HALOPERIDOL DECANOATE 100 MG/ML IM SOLN: 100.0000 mg | INTRAMUSCULAR | 0 refills | Status: AC

## 2017-06-06 MED ORDER — HALOPERIDOL 2 MG PO TABS: 2.0000 mg | ORAL_TABLET | Freq: Two times a day (BID) | ORAL | 0 refills | Status: DC

## 2017-06-06 MED ORDER — HYDROXYZINE HCL 50 MG PO TABS: 50.0000 mg | ORAL_TABLET | Freq: Four times a day (QID) | ORAL | 0 refills | Status: AC | PRN

## 2017-06-06 MED ORDER — BENZTROPINE MESYLATE 1 MG PO TABS: 1.0000 mg | ORAL_TABLET | Freq: Two times a day (BID) | ORAL | 0 refills | Status: AC | PRN

## 2017-06-06 MED ORDER — HALOPERIDOL 5 MG PO TABS: 5.0000 mg | ORAL_TABLET | Freq: Two times a day (BID) | ORAL | 0 refills | Status: AC

## 2017-06-06 MED ORDER — TRAZODONE HCL 50 MG PO TABS: 50.0000 mg | ORAL_TABLET | Freq: Every evening | ORAL | 0 refills | Status: AC | PRN

## 2017-06-06 NOTE — Plan of Care (Signed)
  Problem: Education: Goal: Emotional status will improve Outcome: Progressing   Problem: Activity: Goal: Sleeping patterns will improve Outcome: Progressing   Problem: Safety: Goal: Periods of time without injury will increase Outcome: Progressing   Problem: Coping: Goal: Coping ability will improve Outcome: Progressing   Problem: Health Behavior/Discharge Planning: Goal: Compliance with prescribed medication regimen will improve Outcome: Progressing   Problem: Safety: Goal: Ability to remain free from injury will improve 06/06/2017 0012 by Curly Rim, RN Outcome: Progressing 06/06/2017 0009 by Curly Rim, RN Outcome: Progressing

## 2017-06-06 NOTE — Plan of Care (Signed)
D: Pt denies SI/HI/AV hallucinations. Pt  observed in dayroom watching television. Patient engages in conversation minimal with this Clinical research associate. Did not want to discuss why he is in hospital or plan of care. Patient felt he had a good understanding of his care plan. A: Pt was offered support and encouragement. Pt was given scheduled medications. Pt was encourage to attend groups. Q 15 minute checks were done for safety.  R: Pt is taking medication. Pt has no complaints.Pt receptive to treatment and safety maintained on unit.

## 2017-06-06 NOTE — Discharge Summary (Addendum)
Physician Discharge Summary Note  Patient:  Jordan Gomez is an 27 y.o., male MRN:  [id number] DOB:  [birth year] Patient phone:  [phone] (home)  Patient address:   [place] [address],   Total Time spent with patient: 15 minutes  Date of Admission:  05/31/2017 Date of Discharge: 06/06/2017  Reason for Admission: Disorganized behaviors, agitation and walking out into traffic.  Discharge Diagnoses: Patient Active Problem List   Diagnosis Date Noted  . 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 Acute psychosis (HCC) [F23] 05/08/2017   Past Psychiatric History: Hx. Schizophrenia.  Past Medical History: History reviewed. No pertinent past medical history. History reviewed. No pertinent surgical history.  Family History:  Family History  Family history unknown: Yes   Family Psychiatric  History: mother had suicide attempt  Social History:  Pt was raised in [place] and he has lived in [place] for about 4 months. He is homeless and stays in a tent with a friend. He does not work. He does not have children. He does not have legal or trauma history.  Social History   Substance and Sexual Activity  Alcohol Use Yes  . Frequency: Never   Comment: seldom     Social History   Substance and Sexual Activity  Drug Use Yes  . Types: Marijuana   Comment: occasionally    Social History   Socioeconomic History  . Marital status: Single    Spouse name: Not on file  . Number of children: Not on file  . Years of education: Not on file  . Highest education level: Not on file  Occupational History  . Not on file  Social Needs  . Financial resource strain: Not on file  . Food insecurity:    Worry: Not on file    Inability: Not on file  . Transportation needs:    Medical: Not on file    Non-medical: Not on file  Tobacco Use  . Smoking status: Former Smoker    Types: Cigarettes  . Smokeless tobacco: Never Used  . Tobacco comment: refused   Substance and Sexual Activity  . Alcohol use: Yes    Frequency: Never    Comment: seldom  . Drug use: Yes    Types: Marijuana    Comment: occasionally  . Sexual activity: Not Currently  Lifestyle  . Physical activity:    Days per week: Not on file    Minutes per session: Not on file  . Stress: Not on file  Relationships  . Social connections:    Talks on phone: Not on file    Gets together: Not on file    Attends religious service: Not on file    Active member of club or organization: Not on file    Attends meetings of clubs or organizations: Not on file    Relationship status: Not on file  Other Topics Concern  . Not on file  Social History Narrative  . Not on file   Hospital Course: (Per Md's discharge SRA): Rashaun "Rico" Ndiaye is a 27 y/o M with history of psychosis and recent history of admission to [facility] with discharge on 05/13/17 for psychosis who was admitted from [facility] on [facility]C after he was brought in by [employer] due to disorganized behaviors, agitation, and walking out into traffic. Pt was agitated in the ED and required IM geodon to manage his behaviors. Pt has recent history of multiple presentations to the ED due to mental health concerns, mostly with vague complaints and disorganized  thought pattern. Pt was transferred to [facility] for additional treatment and stabilization.He was started on regimen of haldol at time of initial intake; however, refused all psychotropic medications. Second opinion for forced medications was obtained and completed by Dr. Jola Babinski (filed in paper chart). Pt was accepting of oral haldol and he received long-acting injectable form. He has demonstrated stability of his mood symptoms and good tolerability of his medications.  Besides the use of Haldol 5 mg po & Haldol decanoate monthly injectable 100 mg (next dose due on 07-04-17) for mood control, [place] also was medicated & discharged on; Hydroxyzine 50 mg prn for anxiety, Cogentin 1 mg prn for EPS &  Trazodone 50 mg for insomnia. He was enrolled & participated in the group counseling sessions being offered & held on this unit. He learned coping skills. He presented no other significant pre-existing medical issues that required treatment & or monitoring. She tolerated her treatment regimen without any adverse effects or reactions reported.  Today upon his discharge evaluation with the attending psychiatrist, pt shares, "I'm doing pretty good." He denies any specific concerns. He is sleeping adequately. His appetite is good. He denies other physical complaints. He denies SI/HI/AH/VH. He is tolerating his medication without difficulty or side effects. Pt continues to display poor insight about his condition and benefits of medication, and makes statements that he does not plan to have outpatient follow up. Discussed with patient about the risks of discontinuing his medication including possible worsening of his disorganization symptoms and possible admission to the hospital again. Pt verbalized understanding. He does not represent an imminent risk to himself or others, and he would like to be discharged. He was able to engage in safety planning including plan to return to [facility] or contact emergency services if he feels unable to maintain his own safety or the safety of others. Pt had no further questions, comments, or concerns.  Upon discharge, [place] presents mentally & medically stable. He will continue mental health care on outpatient basis as noted below. He is provided with all the necessary information needed to make this appointment without problems. He received from the [facility] pharmacy a 7 days worth supply samples of his [facility] discharge medications. He left [facility] with all personal belongings in no apparent distress. Transportation per himself (walked home).  Physical Findings: AIMS: Facial and Oral Movements Muscles of Facial Expression: None, normal Lips and Perioral Area: None, normal Jaw: None,  normal Tongue: None, normal,Extremity Movements Upper (arms, wrists, hands, fingers): None, normal Lower (legs, knees, ankles, toes): None, normal, Trunk Movements Neck, shoulders, hips: None, normal, Overall Severity Severity of abnormal movements (highest score from questions above): None, normal Incapacitation due to abnormal movements: None, normal Patient's awareness of abnormal movements (rate only patient's report): No Awareness, Dental Status Current problems with teeth and/or dentures?: No Does patient usually wear dentures?: No  CIWA:    COWS:     Musculoskeletal: Strength & Muscle Tone: within normal limits Gait & Station: normal Patient leans: N/A  Psychiatric Specialty Exam: See Md's SRA Physical Exam  Constitutional: He appears well-developed.  HENT:  Head: Normocephalic.  Eyes: Pupils are equal, round, and reactive to light.  Cardiovascular: Normal rate.  Respiratory: Effort normal.  GI: Soft.  Genitourinary:  Genitourinary Comments: Deferred  Musculoskeletal: Normal range of motion.  Neurological: He is alert.  Skin: Skin is warm.    Review of Systems  Constitutional: Negative.   HENT: Negative.   Eyes: Negative.   Respiratory: Negative.   Cardiovascular:  Negative.   Gastrointestinal: Negative.   Genitourinary: Negative.   Musculoskeletal: Negative.   Skin: Negative.   Neurological: Negative.   Endo/Heme/Allergies: Negative.   Psychiatric/Behavioral: Positive for depression (Stable), hallucinations (Hx. psychosis (Stable)) and substance abuse (Hx. alcohol use disorder (Satble)). Negative for memory loss and suicidal ideas. The patient has insomnia (Stable).     Blood pressure 110/69, pulse 93, temperature 98.3 F (36.8 C), temperature source Oral, resp. rate (!) 24, height  (1.626 m), weight 59.4 kg (131 lb), SpO2 98 %.Body mass index is 22.49 kg/m.   Have you used any form of tobacco in the last 30 days? (Cigarettes, Smokeless Tobacco, Cigars,  and/or Pipes): Patient Refused Screening  Has this patient used any form of tobacco in the last 30 days? (Cigarettes, Smokeless Tobacco, Cigars, and/or Pipes): N/A  Blood Alcohol level:  Lab Results  Component Value Date   ETH 153 (H) 05/28/2017   ETH <10 05/24/2017   Metabolic Disorder Labs:  No results found for: HGBA1C, MPG No results found for: PROLACTIN No results found for: CHOL, TRIG, HDL, CHOLHDL, VLDL, LDLCALC  See Psychiatric Specialty Exam and Suicide Risk Assessment completed by Attending Physician prior to discharge.  Discharge destination:  Home  Is patient on multiple antipsychotic therapies at discharge:  No   Has Patient had three or more failed trials of antipsychotic monotherapy by history:  No  Recommended Plan for Multiple Antipsychotic Therapies: NA  Allergies as of 06/06/2017   No Known Allergies     Medication List    TAKE these medications     Indication  benztropine 1 MG tablet Commonly known as:  COGENTIN Take 1 tablet (1 mg total) by mouth 2 (two) times daily as needed (EPS).  Indication:  Extrapyramidal Reaction caused by Medications   haloperidol 5 MG tablet Commonly known as:  HALDOL Take 1 tablet (5 mg total) by mouth 2 (two) times daily. For mood control What changed:    medication strength  how much to take  additional instructions  Indication:  Mood control   haloperidol decanoate 100 MG/ML injection Commonly known as:  HALDOL DECANOATE Inject 1 mL (100 mg total) into the muscle every 30 (thirty) days. (Due on 07-04-17): For mood control Start taking on:  07/04/2017  Indication:  Mood control   hydrOXYzine 50 MG tablet Commonly known as:  ATARAX/VISTARIL Take 1 tablet (50 mg total) by mouth every 6 (six) hours as needed (mild/moderate anxiety).  Indication:  Feeling Anxious   traZODone 50 MG tablet Commonly known as:  DESYREL Take 1 tablet (50 mg total) by mouth at bedtime as needed for sleep.  Indication:  Trouble  Sleeping      Follow-up [facility]. Go on 06/11/2017.   Why:  Please attend your follow up appt on Wednesday, 06/11/17, at 8:00am. Contact information: [address] [place] [address] [phone]          Follow-up recommendations: Activity:  As tolerated Diet: As recommended by your primary care doctor. Keep all scheduled follow-up appointments as recommended.  Comments: Patient is instructed prior to discharge to: Take all medications as prescribed by his/her mental healthcare provider. Report any adverse effects and or reactions from the medicines to his/her outpatient provider promptly. Patient has been instructed & cautioned: To not engage in alcohol and or illegal drug use while on prescription medicines. In the event of worsening symptoms, patient is instructed to call the crisis hotline, 911 and or go to the  nearest ED for appropriate evaluation and treatment of symptoms. To follow-up with his/her primary care provider for your other medical issues, concerns and or health care needs.   Signed: Armandina Stammer, NP, PMHNP, FNP-BC 06/06/2017, 10:45 AM   Patient seen, Suicide Assessment Completed.  Disposition Plan Reviewed

## 2017-06-06 NOTE — [facility] Suicide Risk Assessment (Signed)
[facility] INPATIENT:  Family/Significant Other Suicide Prevention Education  Suicide Prevention Education:  Patient Refusal for Family/Significant Other Suicide Prevention Education: The patient Jordan Gomez has refused to provide written consent for family/significant other to be provided Family/Significant Other Suicide Prevention Education during admission and/or prior to discharge.  Physician notified.  Ida Rogue 06/06/2017, 10:33 AM

## 2017-06-06 NOTE — [facility] Group Notes (Signed)
[facility] Group Notes:  (Nursing/MHT/Case Management/Adjunct)  Date:  06/06/2017  Time:  11:45 AM  Type of Therapy:  Orientation and Goals Group  Participation Level:  Active  Participation Quality:  Appropriate  Affect:  Appropriate  Cognitive:  Alert and Appropriate  Insight:  Appropriate  Engagement in Group:  Engaged  Modes of Intervention:  Discussion and Orientation  Summary of Progress/Problems: His goal for today was to go home and to make sure that he stays on his medications.   Lylia Karn J Maleeka Sabatino 06/06/2017, 11:45 AM

## 2017-06-06 NOTE — [facility] Suicide Risk Assessment (Signed)
[facility] Discharge Suicide Risk Assessment   Principal Problem: Schizophrenia, undifferentiated (HCC) Discharge Diagnoses:  Patient Active Problem List   Diagnosis Date Noted  . 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 Acute psychosis (HCC) [F23] 05/08/2017    Total Time spent with patient: 30 minutes  Musculoskeletal: Strength & Muscle Tone: within normal limits Gait & Station: normal Patient leans: N/A  Psychiatric Specialty Exam: Review of Systems  Constitutional: Negative for chills and fever.  Respiratory: Negative for cough and shortness of breath.   Cardiovascular: Negative for chest pain.  Gastrointestinal: Negative for abdominal pain, heartburn, nausea and vomiting.  Psychiatric/Behavioral: Negative for depression, hallucinations and suicidal ideas. The patient is not nervous/anxious and does not have insomnia.     Blood pressure 110/69, pulse 93, temperature 98.3 F (36.8 C), temperature source Oral, resp. rate (!) 24, height  (1.626 m), weight 59.4 kg (131 lb), SpO2 98 %.Body mass index is 22.49 kg/m.  General Appearance: Casual and Disheveled  Eye Contact::  Good  Speech:  Clear and Coherent and Normal Rate  Volume:  Normal  Mood:  Euthymic  Affect:  Flat  Thought Process:  Coherent and Goal Directed  Orientation:  Full (Time, Place, and Person)  Thought Content:  Logical  Suicidal Thoughts:  No  Homicidal Thoughts:  No  Memory:  Immediate;   Good Recent;   Good Remote;   Good  Judgement:  Poor  Insight:  Lacking  Psychomotor Activity:  Normal  Concentration:  Fair  Recall:  [employer] of Knowledge:Fair  Language: Fair  Akathisia:  No  Handed:    AIMS (if indicated):     Assets:  Resilience  Sleep:  Number of Hours: 4  Cognition: WNL  ADL's:  Intact   Mental Status Per Nursing Assessment::   On Admission:     Demographic Factors:  Male, Adolescent or young adult, Low socioeconomic status, Living alone and Unemployed  Loss  Factors: Financial problems/change in socioeconomic status  Historical Factors: Impulsivity  Risk Reduction Factors:   Positive coping skills or problem solving skills  Continued Clinical Symptoms:  Schizophrenia:   Paranoid or undifferentiated type  Cognitive Features That Contribute To Risk:  None    Suicide Risk:  Minimal: No identifiable suicidal ideation.  Patients presenting with no risk factors but with morbid ruminations; may be classified as minimal risk based on the severity of the depressive symptoms  Follow-up Information    [facility]. Go on 06/11/2017.   Why:  Please attend your follow up appt on Wednesday, 06/11/17, at 8:00am. Contact information: [address] [place] [address] [phone]         Subjective Data: Jordan Gomez is a 27 y/o M with history of psychosis and recent history of admission to [facility] with discharge on 05/13/17 for psychosis who was admitted from [facility] on [facility]C after he was brought in by [employer] due to disorganized behaviors, agitation, and walking out into traffic. Pt was agitated in the ED and required IM geodon to manage his behaviors. Pt has recent history of multiple presentations to the ED due to mental health concerns, mostly with vague complaints and disorganized thought pattern. Pt was transferred to [facility] for additional treatment and stabilization.He was started on regimen of haldol at time of initial intake; however, refused all psychotropic medications. Second opinion for forced medications was obtained and completed by Dr. Jola Babinski (filed in paper chart). Pt was accepting of oral haldol and he received long-acting injectable form. He  has demonstrated stability of his mood symptoms and good tolerability of his medications.  Today upon evaluation, pt shares, "I'm doing pretty good." He denies any specific concerns. He is sleeping adequately. His appetite is good. He denies other physical complaints. He denies SI/HI/AH/VH. He is  tolerating his medication without difficulty or side effects. Pt continues to display poor insight about his condition and benefits of medication, and makes statements that he does not plan to have outpatient follow up. Discussed with patient about the risks of discontinuing his medication including possible worsening of his disorganization symptoms and possible admission to the hospital again. Pt verbalized understanding. He does not represent an imminent risk to himself or others, and he would like to be discharged. He was able to engage in safety planning including plan to return to [facility] or contact emergency services if he feels unable to maintain his own safety or the safety of others. Pt had no further questions, comments, or concerns.    Plan Of Care/Follow-up recommendations:   -Discharge to outpatient level of care  -Schizophrenia, undifferentiated -Continue haldol  po BID.             -Received Haldol Decanoate 100 mg IM, administered on 06-04-17. Pt recommended to follow up with outpatient provider to continue haldol decanoate  IM q30days.  -Anxiety -Continue atarax  po q6h prn anxiety  -Insomnia -Continue trazodone  po qhs prn insomnia  -EPS             -Continue cogentin  po BID prn EPS  Activity:  as tolerated Diet:  normal Tests:  NA Other:  see above for DC plan  Micheal Likens, MD 06/06/2017, 9:56 AM

## 2017-06-06 NOTE — Progress Notes (Signed)
  [facility] Adult Case Management Discharge Plan :  Will you be returning to the same living situation after discharge:  Yes,  home At discharge, do you have transportation home?: Yes,  states he will walk Do you have the ability to pay for your medications: Yes,  mental health  Release of information consent forms completed and in the chart;  Patient's signature needed at discharge.  Patient to Follow up at: Follow-up Information    [facility]. Go on 06/11/2017.   Why:  Please attend your follow up appt on Wednesday, 06/11/17, at 8:00am. Contact information: [address] [place] [address] [phone]           Next level of care provider has access to [facility] Link:no  Safety Planning and Suicide Prevention discussed: Yes,  yes  Have you used any form of tobacco in the last 30 days? (Cigarettes, Smokeless Tobacco, Cigars, and/or Pipes): Patient Refused Screening  Has patient been referred to the Quitline?: Patient refused referral  Patient has been referred for addiction treatment: N/A  Ida Rogue, LCSW 06/06/2017, 10:29 AM

## 2017-06-06 NOTE — Tx Team (Signed)
Interdisciplinary Treatment and Diagnostic Plan Update  06/06/2017 Time of Session: 10:43 AM  Jordan Gomez MRN: [id number]  Principal Diagnosis: Schizophrenia, undifferentiated ([place])  Secondary Diagnoses: Principal Problem:   Schizophrenia, undifferentiated ([place])   Current Medications:  Current Facility-Administered Medications  Medication Dose Route Frequency Provider Last Rate Last Dose  . acetaminophen (TYLENOL) tablet 650 mg  650 mg Oral Q6H PRN Ethelene Hal, NP      . alum & mag hydroxide-simeth (MAALOX/MYLANTA) 200-200-20 MG/5ML suspension 30 mL  30 mL Oral Q4H PRN Ethelene Hal, NP      . benztropine (COGENTIN) tablet 1 mg  1 mg Oral BID PRN Pennelope Bracken, MD      . haloperidol (HALDOL) tablet 5 mg  5 mg Oral BID Pennelope Bracken, MD   5 mg at 06/06/17 0750   Or  . haloperidol lactate (HALDOL) injection 5 mg  5 mg Intramuscular BID Maris Berger T, MD      . haloperidol decanoate (HALDOL DECANOATE) 100 MG/ML injection 100 mg  100 mg Intramuscular Q30 days Maris Berger T, MD   100 mg at 06/04/17 1646  . hydrOXYzine (ATARAX/VISTARIL) tablet 50 mg  50 mg Oral Q6H PRN Pennelope Bracken, MD   50 mg at 06/03/17 2019  . OLANZapine zydis (ZYPREXA) disintegrating tablet 10 mg  10 mg Oral Q8H PRN Pennelope Bracken, MD       And  . LORazepam (ATIVAN) tablet 1 mg  1 mg Oral PRN Pennelope Bracken, MD       And  . ziprasidone (GEODON) injection 20 mg  20 mg Intramuscular PRN Pennelope Bracken, MD      . magnesium hydroxide (MILK OF MAGNESIA) suspension 30 mL  30 mL Oral Daily PRN Ethelene Hal, NP      . traZODone (DESYREL) tablet 50 mg  50 mg Oral QHS PRN Ethelene Hal, NP   50 mg at 06/05/17 2057    PTA Medications: Medications Prior to Admission  Medication Sig Dispense Refill Last Dose  . [DISCONTINUED] haloperidol (HALDOL) 2 MG tablet Take 1 tablet (2 mg total) by mouth 2 (two)  times daily. 60 tablet 0 not started    Patient Stressors: Financial difficulties Medication change or noncompliance Substance abuse  Patient Strengths: Average or above average intelligence Physical Health  Treatment Modalities: Medication Management, Group therapy, Case management,  1 to 1 session with clinician, Psychoeducation, Recreational therapy.   Physician Treatment Plan for Primary Diagnosis: Schizophrenia, undifferentiated ([place]) Long Term Goal(s): Improvement in symptoms so as ready for discharge  Short Term Goals: Compliance with prescribed medications will improve Ability to identify and develop effective coping behaviors will improve  Medication Management: Evaluate patient's response, side effects, and tolerance of medication regimen.  Therapeutic Interventions: 1 to 1 sessions, Unit Group sessions and Medication administration.  Evaluation of Outcomes: Adequate for Discharge  Physician Treatment Plan for Secondary Diagnosis: Principal Problem:   Schizophrenia, undifferentiated ( [place])   Long Term Goal(s): Improvement in symptoms so as ready for discharge  Short Term Goals: Compliance with prescribed medications will improve Ability to identify and develop effective coping behaviors will improve  Medication Management: Evaluate patient's response, side effects, and tolerance of medication regimen.  Therapeutic Interventions: 1 to 1 sessions, Unit Group sessions and Medication administration.  Evaluation of Outcomes: Adequate for Discharge   RN Treatment Plan for Primary Diagnosis: Schizophrenia, undifferentiated ([place]) Long Term Goal(s): Knowledge of disease and therapeutic regimen to maintain health will improve  Short Term Goals: Compliance with prescribed medications will improve  Medication Management: RN will administer medications as ordered by provider, will assess and evaluate patient's response and provide education to patient for prescribed  medication. RN will report any adverse and/or side effects to prescribing provider.  Therapeutic Interventions: 1 on 1 counseling sessions, Psychoeducation, Medication administration, Evaluate responses to treatment, Monitor vital signs and CBGs as ordered, Perform/monitor CIWA, COWS, AIMS and Fall Risk screenings as ordered, Perform wound care treatments as ordered.  Evaluation of Outcomes: Adequate for [place]   LCSW Treatment Plan for Primary Diagnosis: Schizophrenia, undifferentiated ([place]) Long Term Goal(s): Safe transition to appropriate next level of care at discharge, Engage patient in therapeutic group addressing interpersonal concerns.  Short Term Goals: Engage patient in aftercare planning with referrals and resources  Therapeutic Interventions: Assess for all discharge needs, 1 to 1 time with Social worker, Explore available resources and support systems, Assess for adequacy in community support network, Educate family and significant other(s) on suicide prevention, Complete Psychosocial Assessment, Interpersonal group therapy.  Evaluation of Outcomes: Met  Return home, follow up [facility]   Progress in Treatment: Attending groups: Yes Participating in groups: Yes Taking medication as prescribed: Yes Toleration medication: Yes, no side effects reported at this time Family/Significant other contact made: No Patient understands diagnosis: No  Limited insight Discussing patient identified problems/goals with staff: Yes Medical problems stabilized or resolved: Yes Denies suicidal/homicidal ideation: Yes Issues/concerns per patient self-inventory: None Other: N/A  New problem(s) identified: None identified at this time.   New Short Term/Long Term Goal(s): "I'm feeling better."   Discharge Plan or Barriers:   Reason for Continuation of Hospitalization:  Hallucinations  Medication stabilization   Estimated Length of Stay: D/C today  Attendees: Patient:  06/06/2017   10:43 AM  Physician: Maris Berger, MD 06/06/2017  10:43 AM  Nursing: Sena Hitch, RN 06/06/2017  10:43 AM  RN Care Manager: Lars Pinks, RN 06/06/2017  10:43 AM  Social Worker: Ripley Fraise 06/06/2017  10:43 AM  Recreational Therapist: Winfield Cunas 06/06/2017  10:43 AM  Other: Norberto Sorenson 06/06/2017  10:43 AM  Other:  06/06/2017  10:43 AM    Scribe for Treatment Team:  Roque Lias LCSW 06/06/2017 10:43 AM

## 2017-06-06 NOTE — Progress Notes (Signed)
Patient ID: Jordan Gomez, male   DOB: 09/15/1990, 27 y.o.   MRN: [id number] Patient discharged to home/self care. Upon discharge patient acknowledged understanding discharge instructions and acknowledgement of all personal belongings.

## 2017-06-06 NOTE — Progress Notes (Signed)
Recreation Therapy Notes  Date: 5.3.19 Time: 1000 Location: 500 Hall Dayroom  Group Topic: Music Therapy  Goal Area(s) Addresses:  Patient will be able to identify the benefits of music. Patient will be able to identify emotions that come from music. Patient will identify how music can be used post d/c.   Behavioral Response: Engaged  Intervention: Music  Activity: Music Therapy.  Patients were to identify some of the feelings they have when listening to music.  Patients also shared with the group some of their favorite songs if appropriate.  Education: Communication, Discharge Planning  Education Outcome: Acknowledges understanding/In group clarification offered/Needs additional education.   Clinical Observations/Feedback:  Pt expressed that certain songs "make me feel good".  Pt stated one of the songs he likes is "[employer]" by Switzer because "it sounds good and just has this vibe about it".     Caroll Rancher, LRT/CTRS         Caroll Rancher A 06/06/2017 12:03 PM

## 2017-06-07 ENCOUNTER — Encounter (HOSPITAL_COMMUNITY): Payer: Self-pay

## 2017-06-07 ENCOUNTER — Other Ambulatory Visit: Payer: Self-pay

## 2017-06-07 DIAGNOSIS — F10929 Alcohol use, unspecified with intoxication, unspecified: Secondary | ICD-10-CM | POA: Insufficient documentation

## 2017-06-07 DIAGNOSIS — Z5321 Procedure and treatment not carried out due to patient leaving prior to being seen by health care provider: Secondary | ICD-10-CM | POA: Insufficient documentation

## 2017-06-07 NOTE — ED Triage Notes (Addendum)
Pt BIB [facility], he denies complaints. He states that he doesn't know why they brought him here. He appears intoxicated. He is able to walk under his own power. NAD. Denies SI/HI.

## 2017-06-08 ENCOUNTER — Emergency Department (HOSPITAL_COMMUNITY)
Admission: EM | Admit: 2017-06-08 | Discharge: 2017-06-08 | Disposition: A | Discharge status: Home / Self Care | Payer: Self-pay | Attending: Emergency Medicine | Admitting: Emergency Medicine

## 2017-06-08 NOTE — ED Notes (Signed)
Pt now coming to triage doors and knocking on them and then screaming through the crack in doors saying, "hurry up," then running back to his wheelchair.

## 2017-06-08 NOTE — ED Notes (Signed)
Pt continues to scream through triage doors. When security made aware. Pt threw his stickers on the ground and LWBS.

## 2017-06-09 ENCOUNTER — Emergency Department (HOSPITAL_COMMUNITY)
Admission: EM | Admit: 2017-06-09 | Discharge: 2017-06-09 | Disposition: A | Discharge status: Home / Self Care | Payer: Self-pay | Attending: Emergency Medicine | Admitting: Emergency Medicine

## 2017-06-09 ENCOUNTER — Encounter (HOSPITAL_COMMUNITY): Payer: Self-pay

## 2017-06-09 ENCOUNTER — Ambulatory Visit (HOSPITAL_COMMUNITY)
Admission: RE | Admit: 2017-06-09 | Discharge: 2017-06-09 | Disposition: A | Discharge status: Home / Self Care | Payer: No Typology Code available for payment source | Attending: Psychiatry | Admitting: Psychiatry

## 2017-06-09 ENCOUNTER — Other Ambulatory Visit: Payer: Self-pay

## 2017-06-09 DIAGNOSIS — K149 Disease of tongue, unspecified: Secondary | ICD-10-CM | POA: Insufficient documentation

## 2017-06-09 DIAGNOSIS — K117 Disturbances of salivary secretion: Secondary | ICD-10-CM | POA: Insufficient documentation

## 2017-06-09 DIAGNOSIS — Z87891 Personal history of nicotine dependence: Secondary | ICD-10-CM | POA: Insufficient documentation

## 2017-06-09 DIAGNOSIS — Z79899 Other long term (current) drug therapy: Secondary | ICD-10-CM | POA: Insufficient documentation

## 2017-06-09 DIAGNOSIS — G249 Dystonia, unspecified: Secondary | ICD-10-CM | POA: Insufficient documentation

## 2017-06-09 DIAGNOSIS — F209 Schizophrenia, unspecified: Secondary | ICD-10-CM | POA: Insufficient documentation

## 2017-06-09 MED ORDER — BENZTROPINE MESYLATE 1 MG PO TABS: 1.0000 mg | ORAL_TABLET | Freq: Two times a day (BID) | ORAL | 0 refills | Status: AC

## 2017-06-09 MED ORDER — DIPHENHYDRAMINE HCL 25 MG PO TABS: 25.0000 mg | ORAL_TABLET | Freq: Three times a day (TID) | ORAL | 0 refills | Status: DC

## 2017-06-09 MED ORDER — DIPHENHYDRAMINE HCL 25 MG PO CAPS
50.0000 mg | ORAL_CAPSULE | Freq: Once | ORAL | Status: AC
  Administered 2017-06-09: 50 mg via ORAL
  Filled 2017-06-09: qty 2

## 2017-06-09 MED ORDER — BENZTROPINE MESYLATE 1 MG/ML IJ SOLN
1.0000 mg | Freq: Once | INTRAMUSCULAR | Status: AC
  Administered 2017-06-09: 1 mg via INTRAMUSCULAR
  Filled 2017-06-09: qty 2

## 2017-06-09 NOTE — ED Provider Notes (Signed)
[facility]-EMERGENCY DEPT Provider Note   CSN: [id number] Arrival date & time: 06/09/17  1645     History   Chief Complaint Chief Complaint  Patient presents with  . Drooling  . tongue sweling    HPI Jordan Gomez is a 27 y.o. male.  HPI Patient reports he has back for the "same thing".  He states his tongue feels like it moves around in his mouth and sometimes he drools.  He was given some Benadryl but he still feels like the symptoms are there.  He does not elaborate on any other specific symptoms.  He does endorse that he is homeless.  He is wondering if he can get something to eat. History reviewed. No pertinent past medical history.  Patient Active Problem List   Diagnosis Date Noted  . 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 Acute psychosis (HCC) 05/08/2017    History reviewed. No pertinent surgical history.      Home Medications    Prior to Admission medications   Medication Sig Start Date End Date Taking? Authorizing Provider  benztropine (COGENTIN) 1 MG tablet Take 1 tablet (1 mg total) by mouth 2 (two) times daily as needed (EPS). 06/06/17   Nwoko, Nicole Kindred I, NP  benztropine (COGENTIN) 1 MG tablet Take 1 tablet (1 mg total) by mouth 2 (two) times daily. 06/09/17   Arby Barrette, MD  diphenhydrAMINE (BENADRYL) 25 MG tablet Take 1 tablet (25 mg total) by mouth every 8 (eight) hours for 7 days. 06/09/17 06/16/17  Shaune Pollack, MD  haloperidol (HALDOL) 5 MG tablet Take 1 tablet (5 mg total) by mouth 2 (two) times daily. For mood control 06/06/17   Armandina Stammer I, NP  haloperidol decanoate (HALDOL DECANOATE) 100 MG/ML injection Inject 1 mL (100 mg total) into the muscle every 30 (thirty) days. (Due on 07-04-17): For mood control 07/04/17   Armandina Stammer I, NP  hydrOXYzine (ATARAX/VISTARIL) 50 MG tablet Take 1 tablet (50 mg total) by mouth every 6 (six) hours as needed (mild/moderate anxiety). 06/06/17   Armandina Stammer I, NP  traZODone  (DESYREL) 50 MG tablet Take 1 tablet (50 mg total) by mouth at bedtime as needed for sleep. 06/06/17   Sanjuana Kava, NP    Family History Family History  Family history unknown: Yes    Social History Social History   Tobacco Use  . Smoking status: Former Smoker    Types: Cigarettes  . Smokeless tobacco: Never Used  . Tobacco comment: refused  Substance Use Topics  . Alcohol use: Yes    Frequency: Never    Comment: seldom  . Drug use: Yes    Types: Marijuana    Comment: occasionally     Allergies   Patient has no known allergies.   Review of Systems Review of Systems  10 Systems reviewed and are negative for acute change except as noted in the HPI. Physical Exam Updated Vital Signs BP (!) 93/55   Pulse 64   Temp 98.4 F (36.9 C) (Oral)   Resp 18   Wt 56.7 kg (125 lb 1.6 oz)   SpO2 100%   BMI 21.47 kg/m   Physical Exam  Constitutional: He is oriented to person, place, and time. He appears well-developed and well-nourished.  No signs of distress.  Patient is ambulatory about the emergency department with coordinated gait.  Thin but well-nourished well-developed.  HENT:  Head: Normocephalic and atraumatic.  Mouth/Throat: Oropharynx is clear and moist.  Patient does not have  any tongue swelling or asymmetry.  Tongue is not protruding from the mouth.  Dentition fair condition.  Eyes: Pupils are equal, round, and reactive to light. EOM are normal.  Neck: Neck supple.  Cardiovascular: Normal rate, regular rhythm, normal heart sounds and intact distal pulses.  Pulmonary/Chest: Effort normal and breath sounds normal.  Abdominal: Soft. Bowel sounds are normal. He exhibits no distension. There is no tenderness.  Musculoskeletal: Normal range of motion. He exhibits no edema, tenderness or deformity.  Patient has symmetric well developed musculature.  Neurological: He is alert and oriented to person, place, and time. He has normal strength. He exhibits normal muscle  tone. Coordination normal. GCS eye subscore is 4. GCS verbal subscore is 5. GCS motor subscore is 6.  Patient's movements are coordinated and symmetric.  He is not exhibiting any stiffness or unusual movements.  Skin: Skin is warm, dry and intact.  Psychiatric: He has a normal mood and affect.     ED Treatments / Results  Labs (all labs ordered are listed, but only abnormal results are displayed) Labs Reviewed - No data to display  EKG None  Radiology No results found.  Procedures Procedures (including critical care time)  Medications Ordered in ED Medications  benztropine mesylate (COGENTIN) injection 1 mg (1 mg Intramuscular Given 06/09/17 2002)     Initial Impression / Assessment and Plan / ED Course  I have reviewed the triage vital signs and the nursing notes.  Pertinent labs & imaging results that were available during my care of the patient were reviewed by me and considered in my medical decision making (see chart for details).     Final Clinical Impressions(s) / ED Diagnoses   Final diagnoses:  Drooling  Tongue disorder  Patient again describes problems with his tongue and drooling.  He is not having any active drooling and the tongue is normal in appearance.  It is possible these are feelings of tardive dyskinesia.  The patient apparently is getting Depo Haldol, next due on 5\31\19.  It is unclear if the patient is actually taking any of the other prescribed medications.  He should be taking Cogentin twice daily.  I sincerely doubt that the patient is compliant with outpatient medications.  Social work was consulted.  [name] has reviewed with the patient his available resources.  He reports the patient however said he did not want to do any of those things.  He did not seem amenable to seeking outside social care.  Patient will continue to be high risk for complications of schizophrenia and poor social structure.  At this time however the patient is alert and in no  distress.  He is functioning appropriately in terms of controlled behaviors, appropriately attentive to interactions and directable.  At this time, he does not exhibit decompensated psychosis.  ED Discharge Orders        Ordered    benztropine (COGENTIN) 1 MG tablet  2 times daily     06/09/17 2057       Arby Barrette, MD 06/10/17 [phone]

## 2017-06-09 NOTE — ED Triage Notes (Signed)
Patient was recently discharged today for excessive drooling and tongue swelling. Patient states it seems worse than when he was here earlier.

## 2017-06-09 NOTE — ED Triage Notes (Signed)
Per EMS- EMS was refueling at a gas station and the patient walked up to them c/o excessive drooling and feeling tired. No swelling noted of the oral cavity. Patient was able to swallow and stated if he jogs he is able to swallow better. Patient fell asleep en route to the ED. Patient denies any animal bites.

## 2017-06-09 NOTE — Progress Notes (Signed)
CSW met with pt and offered pt a shelter list, offered pt the contact information/address for the [place] or [facility] (day center for people experiencing homelessness) and the name of Bevely Palmer Placy, N.P. At the [facility] for medication/medical assistance as well as the name for the PATH director at the [facility], but pt refused all.  Pt stated, "I have to be very careful about what I put inside of me" an also, "I have to be very careful about what I do".    CSW asked if there was anything the CSW could do to help and the pt stated, "Well, of course you can, you're alive".   CSW informed pt what the CSW had to offer (shelter lists, information on doctor's etc) and pt stated he needed nothing.  Pt stated he was hungry and asked when he could get something to eat and CSW realyed the question to the pt's RN who provided pt with a sandwich, water and Coke.  CSW asked if he could help the pt with anything else, pt stated no and thanked the CSW.  RN and EDP updated.  Please reconsult if future social work needs arise.  CSW signing off, as social work intervention is no longer needed.  Alphonse Guild. Uriah Philipson, LCSW, LCAS, CSI Clinical Social Worker Ph: [phone]

## 2017-06-09 NOTE — BH Assessment (Signed)
Assessment Note  Jordan Gomez is an 27 y.o. male presents voluntarily to [facility] after leaving [facility]. Pt states his tongue is swollen and making him anxious and "justs wants some help". Presents with complaints of "increased drooling and tongue moving around." He recently was treated with po benadryl per records in epic stating "I can't tell any improvement. I think they let me go too soon. No I don't take any medications." Kerri appears to be a poor historian as there is a [facility] discharge summary from 06/06/2017 indicating that patient was prescribed haldol at time of his discharge, which indicates the patient is likely experiencing acute EPS symptoms. Patient does not meet criteria for inpatient admission. He was advised by staff to return to the [facility] for further treatment. Patient denies taking any psychiatric medications the last few days and appears to have limited insight into his past diagnosis of schizophrenia.     Diagnosis: F20.9 Schizophrenia   Past Medical History: No past medical history on file.  No past surgical history on file.  Family History:  Family History  Family history unknown: Yes    Social History:  reports that he has quit smoking. His smoking use included cigarettes. He has never used smokeless tobacco. He reports that he drinks alcohol. He reports that he has current or past drug history. Drug: Marijuana.  Additional Social History:  Alcohol / Drug Use Pain Medications: Please see MAR Prescriptions: Please see MAR Over the Counter: Please see MAR History of alcohol / drug use?: Yes Longest period of sobriety (when/how long): Unknown Substance #1 Name of Substance 1: Marijuana 1 - Age of First Use: Unknown 1 - Amount (size/oz): Unknown 1 - Frequency: Pt states he uses infrequently 1 - Duration: Unknown Substance #2 Name of Substance 2: EtOH 2 - Age of First Use: Unknown 2 - Amount (size/oz): 2 drinks 2 - Frequency: Pt states he uses infrequently 2 -  Duration: Unknown 2 - Last Use / Amount: 05/24/17  CIWA: CIWA-Ar BP: 113/80 Pulse Rate: 70 COWS:    Allergies: No Known Allergies  Home Medications:  (Not in a hospital admission)  OB/GYN Status:  No LMP for male patient.  General Assessment Data Location of Assessment: [facility] Assessment Services TTS Assessment: In system Is this a Tele or Face-to-Face Assessment?: Face-to-Face Is this an Initial Assessment or a Re-assessment for this encounter?: Initial Assessment Marital status: Single Living Arrangements: Alone Can pt return to current living arrangement?: Yes Admission Status: Voluntary Is patient capable of signing voluntary admission?: Yes Referral Source: Self/Family/Friend Insurance type: Surveyor, minerals Exam [facility] Walk-in ONLY) Medical Exam completed: Yes  Crisis Care Plan Living Arrangements: Alone Name of Psychiatrist: None Name of Therapist: None  Education Status Is patient currently in school?: No Highest grade of school patient has completed: 12th Retail buyer) Is the patient employed, unemployed or receiving disability?: Unemployed  Risk to self with the past 6 months Suicidal Ideation: No Has patient been a risk to self within the past 6 months prior to admission? : No Suicidal Intent: No Has patient had any suicidal intent within the past 6 months prior to admission? : No Is patient at risk for suicide?: No Suicidal Plan?: No Has patient had any suicidal plan within the past 6 months prior to admission? : No What has been your use of drugs/alcohol within the last 12 months?: Cannabis Etoh Previous Attempts/Gestures: No Other Self Harm Risks: None Triggers for Past Attempts: None known Intentional Self Injurious Behavior:  None Family Suicide History: No Recent stressful life event(s): (Swollen tongue) Persecutory voices/beliefs?: No Depression: Yes Depression Symptoms: Despondent, Isolating, Loss of interest in usual  pleasures, Feeling angry/irritable Substance abuse history and/or treatment for substance abuse?: Yes Suicide prevention information given to non-admitted patients: Not applicable  Risk to Others within the past 6 months Homicidal Ideation: No Does patient have any lifetime risk of violence toward others beyond the six months prior to admission? : No Thoughts of Harm to Others: No Current Homicidal Intent: No Current Homicidal Plan: No Access to Homicidal Means: No History of harm to others?: No Assessment of Violence: None Noted Does patient have access to weapons?: No Criminal Charges Pending?: No Does patient have a court date: No Is patient on probation?: No  Psychosis Hallucinations: None noted Delusions: None noted  Mental Status Report Appearance/Hygiene: Disheveled, Body odor, Poor hygiene Eye Contact: Good Motor Activity: Freedom of movement Speech: Tangential, Slurred(Tongue is swollen) Level of Consciousness: Alert Mood: Pleasant Affect: Appropriate to circumstance Anxiety Level: Moderate Thought Processes: Coherent, Relevant Judgement: Partial Orientation: Person, Place, Time, Situation Obsessive Compulsive Thoughts/Behaviors: None  Cognitive Functioning Concentration: Normal Memory: Recent Intact Is patient IDD: No Is patient DD?: No Insight: Fair Impulse Control: Fair Appetite: Good Have you had any weight changes? : No Change Sleep: No Change Total Hours of Sleep: 8 Vegetative Symptoms: None  ADLScreening [facility] Assessment Services) Patient's cognitive ability adequate to safely complete daily activities?: Yes Patient able to express need for assistance with ADLs?: Yes Independently performs ADLs?: Yes (appropriate for developmental age)  Prior Inpatient Therapy Prior Inpatient Therapy: Yes Prior Therapy Dates: 05/2017 Prior Therapy Facilty/Provider(s): [facility] Reason for Treatment: Psychosis  Prior Outpatient Therapy Prior Outpatient  Therapy: No Does patient have an ACCT team?: No Does patient have Intensive In-House Services?  : No Does patient have [facility] services? : No Does patient have P4CC services?: No  ADL Screening (condition at time of admission) Patient's cognitive ability adequate to safely complete daily activities?: Yes Is the patient deaf or have difficulty hearing?: No Does the patient have difficulty seeing, even when wearing glasses/contacts?: No Does the patient have difficulty concentrating, remembering, or making decisions?: Yes Patient able to express need for assistance with ADLs?: Yes Does the patient have difficulty dressing or bathing?: No Independently performs ADLs?: Yes (appropriate for developmental age) Does the patient have difficulty walking or climbing stairs?: No Weakness of Legs: None Weakness of Arms/Hands: None  Home Assistive Devices/Equipment Home Assistive Devices/Equipment: None  Therapy Consults (therapy consults require a physician order) PT Evaluation Needed: No OT Evalulation Needed: No SLP Evaluation Needed: No Abuse/Neglect Assessment (Assessment to be complete while patient is alone) Abuse/Neglect Assessment Can Be Completed: Yes Physical Abuse: Denies Verbal Abuse: Denies Sexual Abuse: Denies Exploitation of patient/patient's resources: Denies Self-Neglect: Denies Values / Beliefs Cultural Requests During Hospitalization: None Spiritual Requests During Hospitalization: None Consults Spiritual Care Consult Needed: No Social Work Consult Needed: No Merchant navy officer (For Healthcare) Does Patient Have a Medical Advance Directive?: No Would patient like information on creating a medical advance directive?: No - Patient declined    Additional Information 1:1 In Past 12 Months?: No CIRT Risk: Yes Elopement Risk: Yes Does patient have medical clearance?: Yes     Disposition:  Disposition Initial Assessment Completed for this Encounter:  Yes Disposition of Patient: Discharge  Per Fransisca Kaufmann, NP pt does not meet inpatient criteria. On Site Evaluation by:   Reviewed with Physician:    Robbie Louis, LPCA  06/09/2017 4:54 PM

## 2017-06-09 NOTE — ED Notes (Signed)
Bed: [facility] Expected date:  Expected time:  Means of arrival:  Comments: 

## 2017-06-09 NOTE — ED Notes (Signed)
Patient was singing and talking very well

## 2017-06-09 NOTE — H&P (Signed)
Behavioral Health Medical Screening Exam  Jordan Gomez is an 27 y.o. male who presents with complaints of "increased drooling and tongue moving around." He recently was treated with po benadryl per records in epic stating "I can't tell any improvement. I think they let me go too soon. No I don't take any medications." Riese appears to be a poor historian as there is a [facility] discharge summary from 06/06/2017 indicating that patient was prescribed haldol at time of his discharge, which indicates the patient is likely experiencing acute EPS symptoms. Patient does not meet criteria for inpatient admission. He was advised by staff to return to the [facility] for further treatment. Patient denies taking any psychiatric medications the last few days and appears to have limited insight into his past diagnosis of schizophrenia.   Total Time spent with patient: 20 minutes  Psychiatric Specialty Exam: Physical Exam  Constitutional: He is oriented to person, place, and time. He appears well-developed and well-nourished.  HENT:  Head: Normocephalic.  Neck: Normal range of motion.  Cardiovascular: Normal rate, regular rhythm, normal heart sounds and intact distal pulses.  Respiratory: Effort normal and breath sounds normal.  GI: Soft. Bowel sounds are normal.  Musculoskeletal: Normal range of motion.  Neurological: He is alert and oriented to person, place, and time.  Skin: Skin is warm and dry.    Review of Systems  Constitutional: Negative for chills, diaphoresis, fever, malaise/fatigue and weight loss.  Eyes: Negative for blurred vision, double vision, photophobia and pain.  Respiratory: Negative for cough, hemoptysis, sputum production, shortness of breath and wheezing.   Cardiovascular: Negative for chest pain, palpitations and orthopnea.  Gastrointestinal: Negative for abdominal pain, heartburn, nausea and vomiting.  Neurological: Negative for dizziness, tingling, tremors, sensory change, speech  change and headaches.  Psychiatric/Behavioral: Negative for depression, hallucinations, memory loss, substance abuse and suicidal ideas. The patient is not nervous/anxious and does not have insomnia.     Blood pressure 113/80, pulse 70, temperature 98.2 F (36.8 C), resp. rate 18, SpO2 100 %.There is no height or weight on file to calculate BMI.  General Appearance: Disheveled and Body odor present  Eye Contact:  Fair  Speech:  Clear and Coherent  Volume:  Normal  Mood:  Anxious  Affect:  Congruent  Thought Process:  Coherent  Orientation:  Full (Time, Place, and Person)  Thought Content:  Concerns about "drooling and tongue movements."  Suicidal Thoughts:  No  Homicidal Thoughts:  No  Memory:  Immediate;   Fair Recent;   Fair Remote;   Fair  Judgement:  Fair  Insight:  Shallow  Psychomotor Activity:  Normal  Concentration: Concentration: Good and Attention Span: Good  Recall:  [employer] of Knowledge:Fair  Language: Good  Akathisia:  No  Handed:  Right  AIMS (if indicated):     Assets:  Communication Skills Desire for Improvement Financial Resources/Insurance Leisure Time Physical Health Resilience  Sleep:       Musculoskeletal: Strength & Muscle Tone: within normal limits Gait & Station: normal Patient leans: N/A  Blood pressure 113/80, pulse 70, temperature 98.2 F (36.8 C), resp. rate 18, SpO2 100 %.  Recommendations:  Based on my evaluation the patient does not appear to have an emergency medical condition. However, he was advised to return to the ED for further management of EPS as he reports his symptoms have not improved.   Fransisca Kaufmann, NP 06/09/2017, 4:43 PM

## 2017-06-09 NOTE — Discharge Instructions (Addendum)
Taking Cogentin twice daily may help your tongue symptoms. 2.  It is very important that you follow-up with your psychiatric provider and use the information given to you to help find social support for homelessness.  Also, a list is attached of clinics that provide healthcare.

## 2017-06-09 NOTE — ED Provider Notes (Signed)
[facility]-EMERGENCY DEPT Provider Note   CSN: [id number] Arrival date & time: 06/09/17  1134     History   Chief Complaint Chief Complaint  Patient presents with  . excessive drooling  . Fatigue    HPI Jordan Gomez is a 27 y.o. male.  HPI   27 year old male with history of schizophrenia here with excessive drooling.  History is somewhat limited due to patient's psychiatric condition, but he states that his primary complaint is that he feels like his tongue intermittently moves and that he produces copious amounts of saliva sometimes.  He states that these seem to get better when he is moving around or when he is distracted.  He denies any difficulty swallowing.  No pain with swallowing.  No neck pain or stiffness.  He has not recently been ill.  He does not believe he has changed any of his medications, though he also admits he does not take his medications regularly.  He strongly denies any active hallucinations.  Denies any HI, SI, or visual hallucinations.  No auditory hallucinations.  No other complaints.  History reviewed. No pertinent past medical history.  Patient Active Problem List   Diagnosis Date Noted  . 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 Acute psychosis (HCC) 05/08/2017    History reviewed. No pertinent surgical history.      Home Medications    Prior to Admission medications   Medication Sig Start Date End Date Taking? Authorizing Provider  benztropine (COGENTIN) 1 MG tablet Take 1 tablet (1 mg total) by mouth 2 (two) times daily as needed (EPS). 06/06/17   Nwoko, Nicole Kindred I, NP  diphenhydrAMINE (BENADRYL) 25 MG tablet Take 1 tablet (25 mg total) by mouth every 8 (eight) hours for 7 days. 06/09/17 06/16/17  Shaune Pollack, MD  haloperidol (HALDOL) 5 MG tablet Take 1 tablet (5 mg total) by mouth 2 (two) times daily. For mood control 06/06/17   Armandina Stammer I, NP  haloperidol decanoate (HALDOL DECANOATE) 100 MG/ML injection  Inject 1 mL (100 mg total) into the muscle every 30 (thirty) days. (Due on 07-04-17): For mood control 07/04/17   Armandina Stammer I, NP  hydrOXYzine (ATARAX/VISTARIL) 50 MG tablet Take 1 tablet (50 mg total) by mouth every 6 (six) hours as needed (mild/moderate anxiety). 06/06/17   Armandina Stammer I, NP  traZODone (DESYREL) 50 MG tablet Take 1 tablet (50 mg total) by mouth at bedtime as needed for sleep. 06/06/17   Sanjuana Kava, NP    Family History Family History  Family history unknown: Yes    Social History Social History   Tobacco Use  . Smoking status: Former Smoker    Types: Cigarettes  . Smokeless tobacco: Never Used  . Tobacco comment: refused  Substance Use Topics  . Alcohol use: Yes    Frequency: Never    Comment: seldom  . Drug use: Yes    Types: Marijuana    Comment: occasionally     Allergies   Patient has no known allergies.   Review of Systems Review of Systems  Constitutional: Negative for chills, fatigue and fever.  HENT: Negative for congestion and rhinorrhea.   Eyes: Negative for visual disturbance.  Respiratory: Negative for cough, shortness of breath and wheezing.   Cardiovascular: Negative for chest pain and leg swelling.  Gastrointestinal: Negative for abdominal pain, diarrhea, nausea and vomiting.  Genitourinary: Negative for dysuria and flank pain.  Musculoskeletal: Negative for neck pain and neck stiffness.  Skin: Negative for rash  and wound.  Allergic/Immunologic: Negative for immunocompromised state.  Neurological: Negative for syncope, weakness and headaches.  All other systems reviewed and are negative.    Physical Exam Updated Vital Signs BP 121/69   Pulse 70   Temp 98.5 F (36.9 C) (Oral)   Resp 18   Ht  (1.626 m)   Wt 59 kg (130 lb)   SpO2 99%   BMI 22.31 kg/m   Physical Exam  Constitutional: He is oriented to person, place, and time. He appears well-developed and well-nourished. No distress.  HENT:  Head: Normocephalic and  atraumatic.  Tongue fasciculations and irregular, erratic movements noted.  Oropharynx is otherwise widely patent.  No peritonsillar abscess.  Uvula is midline.  No stridor.  No carotid bruits.  Neck range of motion is full and painless.  Eyes: Conjunctivae are normal.  Neck: Neck supple.  Cardiovascular: Normal rate, regular rhythm and normal heart sounds. Exam reveals no friction rub.  No murmur heard. Pulmonary/Chest: Effort normal and breath sounds normal. No respiratory distress. He has no wheezes. He has no rales.  Abdominal: He exhibits no distension.  Musculoskeletal: He exhibits no edema.  Neurological: He is alert and oriented to person, place, and time. He exhibits normal muscle tone.  Skin: Skin is warm. Capillary refill takes less than 2 seconds.  Psychiatric: He has a normal mood and affect.  Nursing note and vitals reviewed.    ED Treatments / Results  Labs (all labs ordered are listed, but only abnormal results are displayed) Labs Reviewed - No data to display  EKG None  Radiology No results found.  Procedures Procedures (including critical care time)  Medications Ordered in ED Medications  diphenhydrAMINE (BENADRYL) capsule 50 mg (50 mg Oral Given 06/09/17 1423)     Initial Impression / Assessment and Plan / ED Course  I have reviewed the triage vital signs and the nursing notes.  Pertinent labs & imaging results that were available during my care of the patient were reviewed by me and considered in my medical decision making (see chart for details).     27 year old male with history of schizophrenia here with subjective tongue moving and drooling.  Unfortunately, I am concerned that this could be tardive dyskinesia related to his multiple anti-psychotic medications.  There may also be a component of dystonic reaction.  Patient was given Benadryl with some improvement.  Otherwise, he has no evidence of other acute abnormality.  Psychiatrically, he is at his  baseline with no HI, SI, or AVH.  He has no evidence of infectious etiologies such as peritonsillar abscess, tonsillitis, RPA, epiglottitis, or other infectious trigger.  He was monitored in the ED with no airway compromise or other signs of distress.  Will place him on Benadryl as he reportedly did not fill his Cogentin, and have him call his psychiatrist.  Final Clinical Impressions(s) / ED Diagnoses   Final diagnoses:  Dystonic movements    ED Discharge Orders        Ordered    diphenhydrAMINE (BENADRYL) 25 MG tablet  Every 8 hours     06/09/17 1514       Shaune Pollack, MD 06/09/17 1625

## 2017-06-09 NOTE — Discharge Instructions (Addendum)
LIKE WE TALKED ABOUT, I THINK YOUR TONGUE MOVEMENT AND DROOLING ARE DUE TO A SIDE EFFECT OF SOME OF YOUR MEDICATIONS.  YOU SHOULD BE TAKING COGENTIN (BENZTROPINE) TWICE A DAY. THIS WILL HELP WITH IT.  IF YOU ARE NOT TAKING THIS, YOU CAN TAKE BENADRYL 25 TO 50 MG EVERY 8 HOURS; YOU CAN BUY THIS OVER THE COUNTER, OR IVE GIVEN YOU A PRESCRIPTION

## 2017-06-10 ENCOUNTER — Emergency Department (HOSPITAL_COMMUNITY)
Admission: EM | Admit: 2017-06-10 | Discharge: 2017-06-11 | Discharge status: Against Medical Advice | Payer: Self-pay | Attending: Emergency Medicine | Admitting: Emergency Medicine

## 2017-06-10 ENCOUNTER — Encounter (HOSPITAL_COMMUNITY): Payer: Self-pay

## 2017-06-10 ENCOUNTER — Other Ambulatory Visit: Payer: Self-pay

## 2017-06-10 ENCOUNTER — Emergency Department (HOSPITAL_COMMUNITY)
Admission: EM | Admit: 2017-06-10 | Discharge: 2017-06-10 | Discharge status: Against Medical Advice | Payer: Self-pay | Attending: Emergency Medicine | Admitting: Emergency Medicine

## 2017-06-10 DIAGNOSIS — Z5321 Procedure and treatment not carried out due to patient leaving prior to being seen by health care provider: Secondary | ICD-10-CM | POA: Insufficient documentation

## 2017-06-10 DIAGNOSIS — Z87891 Personal history of nicotine dependence: Secondary | ICD-10-CM | POA: Insufficient documentation

## 2017-06-10 DIAGNOSIS — F209 Schizophrenia, unspecified: Secondary | ICD-10-CM | POA: Insufficient documentation

## 2017-06-10 DIAGNOSIS — Z590 Homelessness: Secondary | ICD-10-CM | POA: Insufficient documentation

## 2017-06-10 DIAGNOSIS — Z79899 Other long term (current) drug therapy: Secondary | ICD-10-CM | POA: Insufficient documentation

## 2017-06-10 LAB — CBC
HEMATOCRIT: 40.5 % (ref 39.0–52.0)
HEMOGLOBIN: 14.5 g/dL (ref 13.0–17.0)
MCH: 29.9 pg (ref 26.0–34.0)
MCHC: 35.8 g/dL (ref 30.0–36.0)
MCV: 83.5 fL (ref 78.0–100.0)
Platelets: 324 10*3/uL (ref 150–400)
RBC: 4.85 MIL/uL (ref 4.22–5.81)
RDW: 12.2 % (ref 11.5–15.5)
WBC: 8.7 10*3/uL (ref 4.0–10.5)

## 2017-06-10 LAB — BASIC METABOLIC PANEL
ANION GAP: 9 (ref 5–15)
BUN: 15 mg/dL (ref 6–20)
CALCIUM: 9.3 mg/dL (ref 8.9–10.3)
CO2: 26 mmol/L (ref 22–32)
Chloride: 100 mmol/L — ABNORMAL LOW (ref 101–111)
Creatinine, Ser: 0.84 mg/dL (ref 0.61–1.24)
Glucose, Bld: 116 mg/dL — ABNORMAL HIGH (ref 65–99)
POTASSIUM: 4 mmol/L (ref 3.5–5.1)
Sodium: 135 mmol/L (ref 135–145)

## 2017-06-10 LAB — RAPID URINE DRUG SCREEN, HOSP PERFORMED
Amphetamines: NOT DETECTED
Barbiturates: NOT DETECTED
Benzodiazepines: NOT DETECTED
COCAINE: NOT DETECTED
OPIATES: NOT DETECTED
TETRAHYDROCANNABINOL: NOT DETECTED

## 2017-06-10 LAB — ETHANOL: Alcohol, Ethyl (B): <10 mg/dL (ref <10)

## 2017-06-10 MED ORDER — ALUM & MAG HYDROXIDE-SIMETH 200-200-20 MG/5ML PO SUSP: 30.0000 mL | Freq: Four times a day (QID) | ORAL | Status: DC | PRN

## 2017-06-10 MED ORDER — ONDANSETRON HCL 4 MG PO TABS: 4.0000 mg | ORAL_TABLET | Freq: Three times a day (TID) | ORAL | Status: DC | PRN

## 2017-06-10 MED ORDER — ACETAMINOPHEN 325 MG PO TABS: 650.0000 mg | ORAL_TABLET | ORAL | Status: DC | PRN

## 2017-06-10 MED ORDER — ZOLPIDEM TARTRATE 5 MG PO TABS: 5.0000 mg | ORAL_TABLET | Freq: Every evening | ORAL | Status: DC | PRN

## 2017-06-10 NOTE — ED Notes (Addendum)
Pt refused blood draw, is pacing in triage room and very paranoid. Lunch meal given.

## 2017-06-10 NOTE — ED Notes (Signed)
ED Provider at bedside. 

## 2017-06-10 NOTE — ED Notes (Signed)
Pt walked out states that he cant do this and was seen walking out of ED

## 2017-06-10 NOTE — BH Assessment (Addendum)
Tele Assessment Note   Patient Name: Jordan Gomez MRN: [id number] Referring Physician: Carlisle Cater PA Location of Patient: [facility] Location of Provider: [place] is an 27 y.o. male who came voluntarily to [facility] again tonight with vague complaints and no specific requests or stated needs. When asked what brought him in tonight pt answered, "I guess I got to hot." Pt was a poor historian and became angry with a verbal outburst stating, "I keep getting asked the same things over and over and over...  And no one wants to help." When asked what kind of help he needs he sts, "it doesn't matter." Pt denies SI, HI, SHI and AVH tonight. Pt has recently been discharged from [facility] in late April 2019 and has been seen in various local EDs daily over the last few days, once multiple times in the same day. Pt had 1 or more psychiatric evaluations each ED admission. Each time when assessed pt denies SI, HI, SHI and AVH. Pt generally requests food and seems to be seeking shelter. Pt has stated he is homeless and living on the streets. Per pt record, pt generally refuses medications, both while IP and after discharge. Pt has not followed up with OP providers Beverly Sessions) after SW arranged follow-up before IP discharge. Per pt record, SW also arranged with [facility] NP for medication assistance and was given shelter information. Pt sts he has not followed up on any of these items and refused resources to Education officer, museum. Per pt hx, pt continues to approach LE and EMS by calling or approaching in person and continues to be brought to various EDs for evaluation. Per pt record and several recent assessments by a variety of professionals, pt seems to be at baseline and does not meet IP criteria.   Per pt record, pt met with a social worker 06/09/17 before ED discharge and was given shelter information, [facility] NP contact information and medication assistance information. Per note, pt refused all  information and did not follow-up.   Pt was dressed in scrubs and sitting on his hospital bed. Pt was alert, irritable and angry. Pt kept good eye contact until he became angry, spoke in a clear tone and at a rapid pace once angry. Pt moved in a normal manner when moving. Pt's thought process was coherent and relevant and judgement seemed partially impaired.  No indication of delusional thinking or response to internal stimuli. Pt's mood was stated as neither depressed or anxious but angry and his blunted, irratable affect was congruent.  Pt was oriented x 4, to person, place, time and situation.   Diagnosis: F20.9 Schizophrenia  Past Medical History: History reviewed. No pertinent past medical history.  History reviewed. No pertinent surgical history.  Family History:  Family History  Family history unknown: Yes    Social History:  reports that he has quit smoking. His smoking use included cigarettes. He has never used smokeless tobacco. He reports that he drinks alcohol. He reports that he has current or past drug history. Drug: Marijuana.  Additional Social History:  Alcohol / Drug Use Prescriptions: SEE MAR  History of alcohol / drug use?: Yes Substance #1 Name of Substance 1: ALCOHOL 1 - Age of First Use: UNK 1 - Amount (size/oz): UNK 1 - Frequency: UNK 1 - Duration: UNK 1 - Last Use / Amount: UNK Substance #2 Name of Substance 2: CANNABIS 2 - Age of First Use: UNK 2 - Amount (size/oz): UNK 2 - Frequency: UNK  2 - Duration: UNK 2 - Last Use / Amount: UNK  CIWA: CIWA-Ar BP: 118/71 Pulse Rate: 68 COWS:    Allergies: No Known Allergies  Home Medications:  (Not in a hospital admission)  OB/GYN Status:  No LMP for male patient.  General Assessment Data Assessment unable to be completed: Yes Reason for not completing assessment: Pt walked out of ED room. Nurse looking for him.  Location of Assessment: [facility] Is this a Tele or Face-to-Face Assessment?: Tele  Assessment Is this an Initial Assessment or a Re-assessment for this encounter?: Initial Assessment Marital status: Single Maiden name: NA Is patient pregnant?: No Pregnancy Status: No Living Arrangements: Other (Comment)(HOMELESS- [place]IVES [place] THE [place]REETS PER TP RECORD) Can pt return to current living arrangement?: Yes Admission Status: Voluntary Is patient capable of signing voluntary admission?: Yes Referral Source: Self/Family/Friend Insurance type: SELF PAY     Crisis Care Plan Living Arrangements: Other (Comment)(HOMELESS- LIVES [place] THE STREETS PER TP RECORD) Name of Psychiatrist: NONE Name of Therapist: NONE  Education Status Is patient currently in school?: No Highest grade of school patient has completed: 12 Is the patient employed, unemployed or receiving disability?: Unemployed  Risk to self with the past 6 months Suicidal Ideation: No Has patient been a risk to self within the past 6 months prior to admission? : No Suicidal Intent: No Has patient had any suicidal intent within the past 6 months prior to admission? : No Is patient at risk for suicide?: No Suicidal Plan?: No Has patient had any suicidal plan within the past 6 months prior to admission? : No Access to Means: (UTA) What has been your use of drugs/alcohol within the last 12 months?: OCCASIONAL Previous Attempts/Gestures: No How many times?: 0 Other Self Harm Risks: NONE REPORTED Triggers for Past Attempts: None known Intentional Self Injurious Behavior: None Family Suicide History: No Recent stressful life event(s): Other (Comment)(HOMELESSNESS-LIVING ON THE STREETS) Persecutory voices/beliefs?: (UTA) Depression: (UTA) Depression Symptoms: (DENIES SYMPTOMS) Substance abuse history and/or treatment for substance abuse?: No Suicide prevention information given to non-admitted patients: Not applicable  Risk to Others within the past 6 months Homicidal Ideation: No Does patient have any lifetime risk  of violence toward others beyond the six months prior to admission? : No Thoughts of Harm to Others: No Current Homicidal Intent: No Current Homicidal Plan: No Access to Homicidal Means: (UTA) Identified Victim: NONE History of harm to others?: No Assessment of Violence: None Noted Violent Behavior Description: NONE NOTED Does patient have access to weapons?: (UTA) Criminal Charges Pending?: (UTA) Does patient have a court date: (UTA) Is patient on probation?: Unknown  Psychosis Delusions: None noted  Mental Status Report Appearance/Hygiene: Disheveled, Body odor Eye Contact: Good Motor Activity: Agitation, Freedom of movement Speech: Pressured, Logical/coherent, Argumentative Level of Consciousness: Alert, Irritable Mood: Angry, Irritable Affect: Flat, Irritable, Angry Anxiety Level: (UTA) Thought Processes: Coherent, Relevant Judgement: Partial Orientation: Person, Place, Time, Situation Obsessive Compulsive Thoughts/Behaviors: Unable to Assess  Cognitive Functioning Concentration: Normal Memory: Unable to Assess Is patient IDD: No Is patient DD?: No Insight: Poor Impulse Control: Unable to Assess Appetite: Good(ASKED FOR FOOD IN ED & ATE) Have you had any weight changes? : (UTA) Amount of the weight change? (lbs): (UTA) Sleep: Unable to Assess Total Hours of Sleep: (UTA) Vegetative Symptoms: Unable to Assess  ADLScreening [facility] Assessment Services) Patient's cognitive ability adequate to safely complete daily activities?: Yes Patient able to express need for assistance with ADLs?: Yes Independently performs ADLs?: Yes (appropriate for developmental  age)  Prior Inpatient Therapy Prior Inpatient Therapy: Yes Prior Therapy Dates: APRIL 2019 Prior Therapy Facilty/Provider(s): [facility] Reason for Treatment: SYMPTOMS OF PSYCHOSIS  Prior Outpatient Therapy Prior Outpatient Therapy: No Does patient have an ACCT team?: No Does patient have Intensive In-House Services?   : No Does patient have [facility] services? : No Does patient have P4CC services?: No  ADL Screening (condition at time of admission) Patient's cognitive ability adequate to safely complete daily activities?: Yes Patient able to express need for assistance with ADLs?: Yes Independently performs ADLs?: Yes (appropriate for developmental age)       Abuse/Neglect Assessment (Assessment to be complete while patient is alone) Physical Abuse: Denies Verbal Abuse: Denies Sexual Abuse: Denies Exploitation of patient/patient's resources: Denies Self-Neglect: Denies     Regulatory affairs officer (For Healthcare) Does Patient Have a Medical Advance Directive?: ([place]) Would patient like information on creating a medical advance directive?: (UTA)          Disposition:  Disposition Initial Assessment Completed for this Encounter: Yes Patient referred to: Other (Comment)(PENDING REVIEW [facility] EXTENDER)  This service was provided via telemedicine using a 2-way, interactive audio and Radiographer, therapeutic.  Names of all persons participating in this telemedicine service and their role in this encounter. Name: Faylene Kurtz, MS, [facility], [facility] Role: Triage Specialist  Name: Demarr "[place]" Johnnye Sima Role: Patient  Name:  Role:   Name:  Role:    Consulted Patriciaann Clan PA. Recommend discharge as pt does not meet IP criteria.   Spoke with Lenn Sink PA (Geiple left for the day) and advised of background, recommendation and rationale.   Faylene Kurtz, MS, [facility], [place] Triage Specialist [facility] T 06/10/2017 8:44 PM

## 2017-06-10 NOTE — ED Triage Notes (Signed)
Pt presents for psych eval, reports he "wasn't feeling right", called the police for help but they referred him here.  Pt reports he wants help "before I go crazy".  Pt denies any suicidal or homicidal ideations, denies any auditory hallucinations.

## 2017-06-10 NOTE — ED Provider Notes (Signed)
[facility] EMERGENCY DEPARTMENT Provider Note   CSN: [id number] Arrival date & time: 06/10/17  1816   History   Chief Complaint Chief Complaint  Patient presents with  . Psychiatric Evaluation    HPI Lequan Friedel is a 27 y.o. male.  HPI    27 year old male presents today for psychiatric evaluation.  Patient is not forthcoming with any discussion.  He was seen by previous provider, he denied any homicidal or suicidal ideations.  He was medically cleared and awaiting TTS.  I spoke with the patient at bedside.  Patient does not respond to questioning of suicidal homicidal ideation, he denies any acute complaints.   History reviewed. No pertinent past medical history.  Patient Active Problem List   Diagnosis Date Noted  . 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 Acute psychosis (HCC) 05/08/2017    History reviewed. No pertinent surgical history.      Home Medications    Prior to Admission medications   Medication Sig Start Date End Date Taking? Authorizing Provider  benztropine (COGENTIN) 1 MG tablet Take 1 tablet (1 mg total) by mouth 2 (two) times daily as needed (EPS). 06/06/17   Nwoko, Nicole Kindred I, NP  benztropine (COGENTIN) 1 MG tablet Take 1 tablet (1 mg total) by mouth 2 (two) times daily. 06/09/17   Arby Barrette, MD  diphenhydrAMINE (BENADRYL) 25 MG tablet Take 1 tablet (25 mg total) by mouth every 8 (eight) hours for 7 days. 06/09/17 06/16/17  Shaune Pollack, MD  haloperidol (HALDOL) 5 MG tablet Take 1 tablet (5 mg total) by mouth 2 (two) times daily. For mood control 06/06/17   Armandina Stammer I, NP  haloperidol decanoate (HALDOL DECANOATE) 100 MG/ML injection Inject 1 mL (100 mg total) into the muscle every 30 (thirty) days. (Due on 07-04-17): For mood control 07/04/17   Armandina Stammer I, NP  hydrOXYzine (ATARAX/VISTARIL) 50 MG tablet Take 1 tablet (50 mg total) by mouth every 6 (six) hours as needed (mild/moderate anxiety). 06/06/17   Armandina Stammer I, NP  traZODone (DESYREL) 50 MG tablet Take 1 tablet (50 mg total) by mouth at bedtime as needed for sleep. 06/06/17   Sanjuana Kava, NP    Family History Family History  Family history unknown: Yes    Social History Social History   Tobacco Use  . Smoking status: Former Smoker    Types: Cigarettes  . Smokeless tobacco: Never Used  . Tobacco comment: refused  Substance Use Topics  . Alcohol use: Yes    Frequency: Never    Comment: seldom  . Drug use: Yes    Types: Marijuana    Comment: occasionally     Allergies   Patient has no known allergies.   Review of Systems Review of Systems  Unable to perform ROS: Other     Physical Exam Updated Vital Signs BP 118/71 (BP Location: Right Arm)   Pulse 68   Temp 99.7 F (37.6 C) (Oral)   Resp 14   SpO2 99%   Physical Exam  Constitutional: He is oriented to person, place, and time. He appears well-developed and well-nourished.  HENT:  Head: Normocephalic and atraumatic.  Eyes: Pupils are equal, round, and reactive to light. Conjunctivae are normal. Right eye exhibits no discharge. Left eye exhibits no discharge. No scleral icterus.  Neck: Normal range of motion. No JVD present. No tracheal deviation present.  Pulmonary/Chest: Effort normal. No stridor.  Neurological: He is alert and oriented to person, place, and time. Coordination  normal.  Psychiatric: He has a normal mood and affect. His behavior is normal. Judgment and thought content normal.  Nursing note and vitals reviewed.    ED Treatments / Results  Labs (all labs ordered are listed, but only abnormal results are displayed) Labs Reviewed  BASIC METABOLIC PANEL - Abnormal; Notable for the following components:      Result Value   Chloride 100 (*)    Glucose, Bld 116 (*)    All other components within normal limits  CBC  ETHANOL  RAPID URINE DRUG SCREEN, HOSP PERFORMED    EKG None  Radiology No results found.  Procedures Procedures  (including critical care time)  Medications Ordered in ED Medications  acetaminophen (TYLENOL) tablet 650 mg (has no administration in time range)  ondansetron (ZOFRAN) tablet 4 mg (has no administration in time range)  alum & mag hydroxide-simeth (MAALOX/MYLANTA) 200-200-20 MG/5ML suspension 30 mL (has no administration in time range)     Initial Impression / Assessment and Plan / ED Course  I have reviewed the triage vital signs and the nursing notes.  Pertinent labs & imaging results that were available during my care of the patient were reviewed by me and considered in my medical decision making (see chart for details).     27 year old male presents today for psychiatric evaluation.  Upon my exam patient is calm and resting comfortably in exam bed.  He denies any complaints but does not provide any significant information.  I specifically asked about homicidal and suicidal ideations, patient would not answer my questions.  Patient has been seen several times for similar presentations.  I spoke with behavioral health who evaluated him they do not see any criteria for inpatient management.  Patient has all the necessary resources as an outpatient and they recommend discharge at this time.  I see no reason why patient would need hospitalization, he will be discharged he had no further questions or concerns.   Final Clinical Impressions(s) / ED Diagnoses   Final diagnoses:  Schizophrenia, unspecified type [facility])    ED Discharge Orders    None       Rosalio Loud 06/10/17 2136    Tegeler, Canary Brim, MD 06/10/17 [phone]

## 2017-06-10 NOTE — ED Notes (Signed)
Patient has 1 shirt, 1 pair of sneakers, 2 pairs of shorts placed in locker #2-Monique,RN

## 2017-06-10 NOTE — Discharge Instructions (Signed)
Please read attached information. If you experience any new or worsening signs or symptoms please return to the emergency room for evaluation. Please follow-up with your primary care provider or specialist as discussed. Please use medication prescribed only as directed and discontinue taking if you have any concerning signs or symptoms.   °

## 2017-06-10 NOTE — ED Notes (Signed)
TTS in process-Monique,RN  

## 2017-06-10 NOTE — ED Notes (Signed)
RN went over discharge paper work; pt states he is not going to [facility]; Charity fundraiser offered other outpatient resources patient refused; pt threw discharge paper work and outpatient resource packet in trash can-Monique,RN

## 2017-06-10 NOTE — ED Notes (Signed)
Patient in and out of room asking what he is waiting on.  Encouraged patient to remain in room.

## 2017-06-10 NOTE — ED Provider Notes (Addendum)
Patient placed in Quick Look pathway, seen and evaluated   Chief Complaint: psych  HPI: Patient with history of schizophrenia presents to the emergency department.  He was seen at [facility] twice yesterday and had a recent admission to the behavioral health hospital.  Patient presents to the emergency department not feeling well but when asked for details patient only responds "I do not know what to tell you".  He denies any acute conditions with his general health.  He states that he is not currently taking medications.  ROS:  Positive ROS: (+)  Negative ROS: (-) fever, nausea and vomiting  Physical Exam:   Gen: No distress  Neuro: Awake and Alert  Skin: Warm    Focused Exam: Heart RRR, nml S1,S2, no m/r/g; Lungs CTAB; Abd soft, NT, no rebound or guarding; Ext 2+ pedal pulses bilaterally, no edema.  BP 118/71 (BP Location: Right Arm)   Pulse 68   Temp 99.7 F (37.6 C) (Oral)   Resp 14   SpO2 99%   Plan: Patient with h/o schizophrenia. I have not seen this patient before and I am not certain as to his baseline, but nothing about current encounter makes me think symptoms are emergent. He is willing to have psych evaluation. Will await input.   Initiation of care has begun. The patient has been counseled on the process, plan, and necessity for staying for the completion/evaluation, and the remainder of the medical screening examination  9:01 PM Patient is medically cleared.     Renne Crigler, PA-C 06/10/17 1942    Renne Crigler, PA-C 06/10/17 2101    Eber Hong, MD 06/11/17 1009

## 2017-06-11 ENCOUNTER — Emergency Department (HOSPITAL_COMMUNITY)
Admission: EM | Admit: 2017-06-11 | Discharge: 2017-06-11 | Disposition: A | Discharge status: Home / Self Care | Payer: Self-pay | Attending: Emergency Medicine | Admitting: Emergency Medicine

## 2017-06-11 ENCOUNTER — Emergency Department (HOSPITAL_COMMUNITY): Admission: EM | Admit: 2017-06-11 | Discharge: 2017-06-11 | Discharge status: Against Medical Advice | Payer: Self-pay

## 2017-06-11 ENCOUNTER — Encounter (HOSPITAL_COMMUNITY): Payer: Self-pay | Admitting: Emergency Medicine

## 2017-06-11 ENCOUNTER — Emergency Department (HOSPITAL_COMMUNITY)
Admission: EM | Admit: 2017-06-11 | Discharge: 2017-06-12 | Disposition: A | Discharge status: Home / Self Care | Payer: Self-pay | Attending: Emergency Medicine | Admitting: Emergency Medicine

## 2017-06-11 ENCOUNTER — Other Ambulatory Visit: Payer: Self-pay

## 2017-06-11 ENCOUNTER — Encounter (HOSPITAL_COMMUNITY): Payer: Self-pay

## 2017-06-11 DIAGNOSIS — K117 Disturbances of salivary secretion: Secondary | ICD-10-CM | POA: Insufficient documentation

## 2017-06-11 DIAGNOSIS — R69 Illness, unspecified: Secondary | ICD-10-CM | POA: Insufficient documentation

## 2017-06-11 DIAGNOSIS — Z87891 Personal history of nicotine dependence: Secondary | ICD-10-CM | POA: Insufficient documentation

## 2017-06-11 DIAGNOSIS — Z5321 Procedure and treatment not carried out due to patient leaving prior to being seen by health care provider: Secondary | ICD-10-CM | POA: Insufficient documentation

## 2017-06-11 MED ORDER — DIPHENHYDRAMINE HCL 25 MG PO TABS: 25.0000 mg | ORAL_TABLET | Freq: Three times a day (TID) | ORAL | 0 refills | Status: AC

## 2017-06-11 NOTE — ED Triage Notes (Signed)
Pt denies any SI/HI. Pt able to maintain airway without any difficulty.

## 2017-06-11 NOTE — ED Triage Notes (Signed)
Pt reports he has been having drooling intermittently. Pt was seen for same at [facility] on 06/10/17.

## 2017-06-11 NOTE — ED Notes (Signed)
ED Provider at bedside. 

## 2017-06-11 NOTE — ED Notes (Signed)
Pt not in lobby.  

## 2017-06-11 NOTE — ED Notes (Signed)
Patient brought in by EMS; states that he just doesn't feel well. Patient stated that he would only see a dr if he didn't have to give blood work/urine. Patient advised that I could not make that guarantee. Patient became angered after I asked questions about his health and left.

## 2017-06-11 NOTE — Discharge Instructions (Addendum)
You may take benadryl for the saliva. If cogentin does not seem to help, you do not need to take it.

## 2017-06-11 NOTE — ED Provider Notes (Signed)
[facility]-EMERGENCY DEPT Provider Note   CSN: [id number] Arrival date & time: 06/11/17  0708     History   Chief Complaint Chief Complaint  Patient presents with  . Excessive Saliva    HPI Jordan Gomez is a 27 y.o. male with a pMH of schizophrenia. He presents for Sialorrhea. He has been seen previously for the same. He states that he has been given cogentin, which he feels did not improve sxs and benadryl in the Past. He feels Benadryl worked the best. He takes Haldol. He denies dental pain.  HPI  History reviewed. No pertinent past medical history.  Patient Active Problem List   Diagnosis Date Noted  . 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 Acute psychosis (HCC) 05/08/2017    History reviewed. No pertinent surgical history.      Home Medications    Prior to Admission medications   Medication Sig Start Date End Date Taking? Authorizing Provider  benztropine (COGENTIN) 1 MG tablet Take 1 tablet (1 mg total) by mouth 2 (two) times daily as needed (EPS). 06/06/17   Nwoko, Nicole Kindred I, NP  benztropine (COGENTIN) 1 MG tablet Take 1 tablet (1 mg total) by mouth 2 (two) times daily. 06/09/17   Arby Barrette, MD  diphenhydrAMINE (BENADRYL) 25 MG tablet Take 1 tablet (25 mg total) by mouth every 8 (eight) hours for 7 days. 06/09/17 06/16/17  Shaune Pollack, MD  haloperidol (HALDOL) 5 MG tablet Take 1 tablet (5 mg total) by mouth 2 (two) times daily. For mood control 06/06/17   Armandina Stammer I, NP  haloperidol decanoate (HALDOL DECANOATE) 100 MG/ML injection Inject 1 mL (100 mg total) into the muscle every 30 (thirty) days. (Due on 07-04-17): For mood control 07/04/17   Armandina Stammer I, NP  hydrOXYzine (ATARAX/VISTARIL) 50 MG tablet Take 1 tablet (50 mg total) by mouth every 6 (six) hours as needed (mild/moderate anxiety). 06/06/17   Armandina Stammer I, NP  traZODone (DESYREL) 50 MG tablet Take 1 tablet (50 mg total) by mouth at bedtime as needed for sleep.  06/06/17   Sanjuana Kava, NP    Family History Family History  Family history unknown: Yes    Social History Social History   Tobacco Use  . Smoking status: Former Smoker    Types: Cigarettes  . Smokeless tobacco: Never Used  . Tobacco comment: refused  Substance Use Topics  . Alcohol use: Yes    Frequency: Never    Comment: seldom  . Drug use: Yes    Types: Marijuana    Comment: occasionally     Allergies   Patient has no known allergies.   Review of Systems Review of Systems Ten systems reviewed and are negative for acute change, except as noted in the HPI.    Physical Exam Updated Vital Signs BP 112/74 (BP Location: Left Arm)   Pulse 80   Temp 98.1 F (36.7 C) (Oral)   Resp 16   Ht  (1.651 m)   Wt 56.7 kg (125 lb)   SpO2 98%   BMI 20.80 kg/m   Physical Exam  Constitutional: He appears well-developed and well-nourished. No distress.  HENT:  Head: Normocephalic and atraumatic.  Mouth/Throat: Uvula is midline and oropharynx is clear and moist. No trismus in the jaw. Normal dentition. No uvula swelling.  Eyes: Pupils are equal, round, and reactive to light. Conjunctivae and EOM are normal. No scleral icterus.  Neck: Normal range of motion. Neck supple.  Cardiovascular: Normal rate,  regular rhythm and normal heart sounds.  Pulmonary/Chest: Effort normal and breath sounds normal. No respiratory distress.  Abdominal: Soft. There is no tenderness.  Musculoskeletal: He exhibits no edema.  Neurological: He is alert.  Skin: Skin is warm and dry. He is not diaphoretic.  Psychiatric: His behavior is normal.  Nursing note and vitals reviewed.    ED Treatments / Results  Labs (all labs ordered are listed, but only abnormal results are displayed) Labs Reviewed - No data to display  EKG None  Radiology No results found.  Procedures Procedures (including critical care time)  Medications Ordered in ED Medications - No data to display   Initial  Impression / Assessment and Plan / ED Course  I have reviewed the triage vital signs and the nursing notes.  Pertinent labs & imaging results that were available during my care of the patient were reviewed by me and considered in my medical decision making (see chart for details).     Patient with sialorrhea, suspect Haldol side effect. Patient may continue with benadryl. I have discussed follow up with prescribing physician for med management.  Final Clinical Impressions(s) / ED Diagnoses   Final diagnoses:  None    ED Discharge Orders    None       Arthor Captain, PA-C 06/11/17 1116    Mancel Bale, MD 06/11/17 [phone]

## 2017-06-11 NOTE — ED Triage Notes (Signed)
Pt came back to front desk and stated that he wanted to be seen. Pt placed back in system. Pt endorses "I just don't feel well all over" Denies pain, cough, n/v/d. VSS. Afebrile. Placed back into waiting room. Blood work was drawn last night here.

## 2017-06-11 NOTE — ED Triage Notes (Signed)
Pt verbalizes excessive saliva in which he has to spit over past week; denies recent sickness, other symptoms, or pain.

## 2017-06-11 NOTE — ED Notes (Signed)
No reply for vitals x3. 

## 2017-06-12 ENCOUNTER — Other Ambulatory Visit: Payer: Self-pay

## 2017-06-12 ENCOUNTER — Encounter (HOSPITAL_COMMUNITY): Payer: Self-pay | Admitting: Emergency Medicine

## 2017-06-12 ENCOUNTER — Emergency Department (HOSPITAL_COMMUNITY)
Admission: EM | Admit: 2017-06-12 | Discharge: 2017-06-12 | Disposition: A | Discharge status: Home / Self Care | Payer: Self-pay | Attending: Physician Assistant | Admitting: Physician Assistant

## 2017-06-12 DIAGNOSIS — F259 Schizoaffective disorder, unspecified: Secondary | ICD-10-CM | POA: Insufficient documentation

## 2017-06-12 DIAGNOSIS — T730XXA Starvation, initial encounter: Secondary | ICD-10-CM

## 2017-06-12 DIAGNOSIS — Z79899 Other long term (current) drug therapy: Secondary | ICD-10-CM | POA: Insufficient documentation

## 2017-06-12 DIAGNOSIS — Z87891 Personal history of nicotine dependence: Secondary | ICD-10-CM | POA: Insufficient documentation

## 2017-06-12 HISTORY — DX: Schizoaffective disorder, bipolar type: F25.0

## 2017-06-12 HISTORY — DX: Schizoaffective disorder, unspecified: F25.9

## 2017-06-12 NOTE — ED Triage Notes (Signed)
Pt presents with vague complaints. States he "doesn't feel well". Denies chest pain/shortness of breath/abdominal pain. Pt reports that he is homeless and spent the night outside.

## 2017-06-12 NOTE — ED Notes (Signed)
Breakfast tray delivered to pt.

## 2017-06-12 NOTE — ED Provider Notes (Signed)
[facility] EMERGENCY DEPARTMENT Provider Note   CSN: [id number] Arrival date & time: 06/12/17  7829     History   Chief Complaint Chief Complaint  Patient presents with  . Generalized Body Aches    HPI Jordan Gomez is a 27 y.o. male.  HPI   Patient is a 27 year old male presenting with no complaints.  Patient reports he does not have any complaints this time.  No chest pain no abdominal pain no vomiting no diarrhea.  He does report that he is hungry and would like a place to rest.  Past Medical History:  Diagnosis Date  . Schizo affective schizophrenia [facility])     Patient Active Problem List   Diagnosis Date Noted  . 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 Acute psychosis (HCC) 05/08/2017    History reviewed. No pertinent surgical history.      Home Medications    Prior to Admission medications   Medication Sig Start Date End Date Taking? Authorizing Provider  benztropine (COGENTIN) 1 MG tablet Take 1 tablet (1 mg total) by mouth 2 (two) times daily as needed (EPS). 06/06/17   Nwoko, Nicole Kindred I, NP  benztropine (COGENTIN) 1 MG tablet Take 1 tablet (1 mg total) by mouth 2 (two) times daily. 06/09/17   Arby Barrette, MD  diphenhydrAMINE (BENADRYL) 25 MG tablet Take 1 tablet (25 mg total) by mouth every 8 (eight) hours for 7 days. 06/11/17 06/18/17  Arthor Captain, PA-C  haloperidol (HALDOL) 5 MG tablet Take 1 tablet (5 mg total) by mouth 2 (two) times daily. For mood control 06/06/17   Armandina Stammer I, NP  haloperidol decanoate (HALDOL DECANOATE) 100 MG/ML injection Inject 1 mL (100 mg total) into the muscle every 30 (thirty) days. (Due on 07-04-17): For mood control 07/04/17   Armandina Stammer I, NP  hydrOXYzine (ATARAX/VISTARIL) 50 MG tablet Take 1 tablet (50 mg total) by mouth every 6 (six) hours as needed (mild/moderate anxiety). 06/06/17   Armandina Stammer I, NP  traZODone (DESYREL) 50 MG tablet Take 1 tablet (50 mg total) by mouth at bedtime as  needed for sleep. 06/06/17   Sanjuana Kava, NP    Family History Family History  Family history unknown: Yes    Social History Social History   Tobacco Use  . Smoking status: Former Smoker    Types: Cigarettes  . Smokeless tobacco: Never Used  . Tobacco comment: refused  Substance Use Topics  . Alcohol use: Yes    Frequency: Never    Comment: seldom  . Drug use: Yes    Types: Marijuana    Comment: occasionally     Allergies   Patient has no known allergies.   Review of Systems Review of Systems  Constitutional: Negative for activity change.  Respiratory: Negative for shortness of breath.   Cardiovascular: Negative for chest pain.  Gastrointestinal: Negative for abdominal pain.  All other systems reviewed and are negative.    Physical Exam Updated Vital Signs BP 116/67 (BP Location: Right Arm)   Pulse 76   Temp 98.7 F (37.1 C) (Oral)   Resp 20   SpO2 100%   Physical Exam  Constitutional: He is oriented to person, place, and time. He appears well-nourished.  HENT:  Head: Normocephalic.  Eyes: Conjunctivae are normal.  Cardiovascular: Normal rate and regular rhythm.  Pulmonary/Chest: Effort normal and breath sounds normal.  Neurological: He is oriented to person, place, and time.  Skin: Skin is warm and dry. He is not diaphoretic.  Psychiatric: He has a normal mood and affect. His behavior is normal.     ED Treatments / Results  Labs (all labs ordered are listed, but only abnormal results are displayed) Labs Reviewed - No data to display  EKG None  Radiology No results found.  Procedures Procedures (including critical care time)  Medications Ordered in ED Medications - No data to display   Initial Impression / Assessment and Plan / ED Course  I have reviewed the triage vital signs and the nursing notes.  Pertinent labs & imaging results that were available during my care of the patient were reviewed by me and considered in my medical  decision making (see chart for details).     Patient is a 27 year old male presenting with no complaints.  Patient reports he does not have any complaints this time.  No chest pain no abdominal pain no vomiting no diarrhea.  He does report that he is hungry and would like a place to rest.  8:25 AM Ordered a hot meal for the patient, give him placed rest currently.  Will recheck after patient is fed and rested.  Patient ate a warm meal, rested, feels much improved and would like to go home.  Final Clinical Impressions(s) / ED Diagnoses   Final diagnoses:  None    ED Discharge Orders    None       Abelino Derrick, MD 06/13/17 [phone]

## 2017-06-12 NOTE — Discharge Instructions (Signed)
Substance Abuse Treatment Programs ° °Intensive Outpatient Programs °[facility]     °[address]      °[place], [place]                   °[phone]      ° °[facility] °[address] °[place], [place] °[phone] ° °[facility]     °(Inpatient and outpatient)     °[address]           °[phone]   ° °[facility] °[phone] (Suboxone and Methadone) ° °[address]      °[place], [place] [address]      °336-882-2125      ° °[address] °[place], [place] °852-3033 ° °[facility] (Outpatient/Inpatient, [facility])    °(insurance only) [phone]      °       °[facility] (Groups & Residential) °[place], [place] °[phone] ° °   °[facility]     °[address]     °[place], [place]      °[phone]      ° °[facility] (for caregivers and family) °[address] °[place], [place] °336-299-4655 ° ° ° ° ° °Residential Treatment Programs °[facility]      °[address], [place], [place] [address]  °([phone]      ° °[facility] °[address] [place], [place] [address] °[phone] ° °[facility]        °[phone]      ° °[facility] °1-800-659-3381 ° °[facility]             °[address]                                         °[place], [place]                                                °877-615-2722 or 336-784-9470                              ° °[facility] [place] °[address] °[place], [address] °1.877.941.8954 ° °[facility]    °[address]      °[place], [place]     °[phone]      ° °[facility] °[address] °[place], [place] °[phone] ° °[facility]   °[address]     °[place], [place] [address]     °[phone]      °Admissions: 8am-3pm M-F ° °[facility] °[address] °[place],  [place] °336-227-7417 ° °[facility]: Residential Program (90 Days)   °[place], [place]      °336-725-8389 or [phone]    ° °[facility] °[place], [place] °(Walk in Hours over the weekend or by referral) ° °[facility] °[address], [place],  [address] °([phone] ° °Crisis Mobile: [facility]:  1-877-626-1772 (for crisis response 24 hours a day) °[facility] Hotline:      1-800-256-2452 °Outpatient Psychiatry and Counseling ° °[facility]: Mobile Crisis   Management 24 hours:  1-877-626-1772 ° °[facility] sliding scale fee and walk in schedule: M-F 8am-12pm/1pm-3pm °[address]  °[place], [place] [address] °[phone] ° °[facility] °[address] °[place], [place] [address] °[phone] ° °[facility] (Former[facility] known [facility])- new patient walk-in appointments available Monday - Friday 8am -3pm.          °[address] °[place], [place] [address] °336-676-6840 or crisis line- 336-676-6905 ° °[facility]/ Intensive Outpatient Therapy Program °[address] °[place], [place] [address] °336-832-9804 ° °[facility]                  °Crisis Services      °[phone]      °[address]     °[place], [place] [address]                ° °[facility]   °[facility] °[phone] °[address] °[place], [place] [address] ° ° °[facility]          °[address],  °[place], [place] [address]       °([phone] ° °[facility] °[address] °[place], [place] [address] °336-292-1510 ° °[facility]    °[address]    °[place], [place] [address]     °[phone]      ° °Parish McKinney, MD     °[address]     °[place] [address]     °[phone]     °  °[facility] °[address] °[place] [address] ° °[facility]     °[address]     °[place], [place]      °336-542-2076      ° °[facility] °Shamsher Ahluwalia, MD °[address] °[place], [place] [address] °[phone] ° °[facility]     °[address]     °[place], [place] [address]     °336-274-1237      ° °[facility] °[address] °[place], [place] [address] °336-854-4450 °Resources for Temporary Residential Assistance/Crisis Centers ° °DAY CENTERS °[facility] °M-F 8am-3pm   °[address] [place], [place] [address]   336-332-0824 °Services include: laundry, barbering, support groups, case management, phone  & computer access, showers, AA/NA mtgs, mental health/substance abuse nurse, job skills class, disability information, VA assistance, spiritual classes, etc.  ° °HOMELESS SHELTERS ° °[facility]     °[facility]   °[address], [place]     °[phone]       °       °[facility] (women and children)       °[address] °[place], [place] [address] °336-275-0820 °[email or web address] for application and process °Application Required ° °[facility]   °[address]    °[place] [address]     °[phone]       °             °[facility] °[address] °, [place] [address] °336.273.5572 °336-235-0363(schedule application appt.) °Application Required ° °[facility] (women only)    °[address]     °[place],  [address]     °336-884-1039      °  Intake starts 6pm daily °Need valid ID, SSC, & Police report °[facility] [place] °[address] °[place], [place] °336-881-5420 °Application Required ° °[facility] (men only)     °[address]      °[place], [place]     °[phone]      ° °[facility] °(Pregnant women only) °[address] °[place], [place] °336-275-0206 ° °[facility]      °[address]      °[place], [place] [address]     °[phone]      °       °[facility] [place] [facility] °[address] °[place], [place] °336-723-1848 °90 day commitment/SA/Application process ° °[facility](men only)     °[address]     °[place], [place]     °[phone]       °Check-in at 7pm     °       °[facility] °[address] °[place], [place] [address] °336-248-6684 °Men/Women/Women and Children must be there by 7 pm ° °[facility] °[place], [place] °336-722-8721                ° °

## 2017-06-13 ENCOUNTER — Emergency Department (HOSPITAL_COMMUNITY): Admission: EM | Admit: 2017-06-13 | Discharge: 2017-06-13 | Discharge status: Against Medical Advice | Payer: Self-pay

## 2017-06-13 NOTE — ED Triage Notes (Signed)
Per [facility] pt from [employer] where [employer] called EMS fro his excessive saliva.

## 2017-06-14 ENCOUNTER — Emergency Department (HOSPITAL_COMMUNITY)
Admission: EM | Admit: 2017-06-14 | Discharge: 2017-06-15 | Disposition: A | Discharge status: Home / Self Care | Payer: Self-pay | Attending: Emergency Medicine | Admitting: Emergency Medicine

## 2017-06-14 ENCOUNTER — Emergency Department (HOSPITAL_COMMUNITY): Payer: Self-pay

## 2017-06-14 ENCOUNTER — Other Ambulatory Visit: Payer: Self-pay

## 2017-06-14 ENCOUNTER — Encounter (HOSPITAL_COMMUNITY): Payer: Self-pay | Admitting: *Deleted

## 2017-06-14 DIAGNOSIS — F10929 Alcohol use, unspecified with intoxication, unspecified: Secondary | ICD-10-CM | POA: Insufficient documentation

## 2017-06-14 DIAGNOSIS — Z046 Encounter for general psychiatric examination, requested by authority: Secondary | ICD-10-CM | POA: Insufficient documentation

## 2017-06-14 DIAGNOSIS — F209 Schizophrenia, unspecified: Secondary | ICD-10-CM | POA: Insufficient documentation

## 2017-06-14 DIAGNOSIS — R451 Restlessness and agitation: Secondary | ICD-10-CM | POA: Insufficient documentation

## 2017-06-14 DIAGNOSIS — R55 Syncope and collapse: Secondary | ICD-10-CM | POA: Insufficient documentation

## 2017-06-14 DIAGNOSIS — F1014 Alcohol abuse with alcohol-induced mood disorder: Secondary | ICD-10-CM | POA: Diagnosis present

## 2017-06-14 DIAGNOSIS — Z87891 Personal history of nicotine dependence: Secondary | ICD-10-CM | POA: Insufficient documentation

## 2017-06-14 LAB — CBC WITH DIFFERENTIAL/PLATELET
BASOS PCT: 0 %
Basophils Absolute: 0 10*3/uL (ref 0.0–0.1)
EOS ABS: 0 10*3/uL (ref 0.0–0.7)
Eosinophils Relative: 0 %
HCT: 39.4 % (ref 39.0–52.0)
Hemoglobin: 13.7 g/dL (ref 13.0–17.0)
Lymphocytes Relative: 17 %
Lymphs Abs: 1.4 10*3/uL (ref 0.7–4.0)
MCH: 29.3 pg (ref 26.0–34.0)
MCHC: 34.8 g/dL (ref 30.0–36.0)
MCV: 84.2 fL (ref 78.0–100.0)
MONOS PCT: 5 %
Monocytes Absolute: 0.4 10*3/uL (ref 0.1–1.0)
NEUTROS ABS: 6.5 10*3/uL (ref 1.7–7.7)
Neutrophils Relative %: 78 %
Platelets: 304 10*3/uL (ref 150–400)
RBC: 4.68 MIL/uL (ref 4.22–5.81)
RDW: 12.4 % (ref 11.5–15.5)
WBC: 8.3 10*3/uL (ref 4.0–10.5)

## 2017-06-14 LAB — ACETAMINOPHEN LEVEL

## 2017-06-14 LAB — COMPREHENSIVE METABOLIC PANEL
ALT: 13 U/L — AB (ref 17–63)
ANION GAP: 11 (ref 5–15)
AST: 21 U/L (ref 15–41)
Albumin: 3.9 g/dL (ref 3.5–5.0)
Alkaline Phosphatase: 57 U/L (ref 38–126)
BUN: 10 mg/dL (ref 6–20)
CALCIUM: 8.3 mg/dL — AB (ref 8.9–10.3)
CO2: 23 mmol/L (ref 22–32)
CREATININE: 0.78 mg/dL (ref 0.61–1.24)
Chloride: 102 mmol/L (ref 101–111)
Glucose, Bld: 101 mg/dL — ABNORMAL HIGH (ref 65–99)
Potassium: 3.8 mmol/L (ref 3.5–5.1)
SODIUM: 136 mmol/L (ref 135–145)
Total Bilirubin: 0.5 mg/dL (ref 0.3–1.2)
Total Protein: 6.9 g/dL (ref 6.5–8.1)

## 2017-06-14 LAB — RAPID URINE DRUG SCREEN, HOSP PERFORMED
Amphetamines: NOT DETECTED
BARBITURATES: NOT DETECTED
BENZODIAZEPINES: NOT DETECTED
COCAINE: NOT DETECTED
Opiates: NOT DETECTED
TETRAHYDROCANNABINOL: POSITIVE — AB

## 2017-06-14 LAB — CBG MONITORING, ED: Glucose-Capillary: 99 mg/dL (ref 65–99)

## 2017-06-14 LAB — SALICYLATE LEVEL

## 2017-06-14 LAB — LIPASE, BLOOD: LIPASE: 22 U/L (ref 11–51)

## 2017-06-14 LAB — ETHANOL: Alcohol, Ethyl (B): 203 mg/dL — ABNORMAL HIGH (ref <10)

## 2017-06-14 MED ORDER — LORAZEPAM 2 MG/ML IJ SOLN: 1.0000 mg | Freq: Once | INTRAMUSCULAR | Status: DC

## 2017-06-14 MED ORDER — LORAZEPAM 2 MG/ML IJ SOLN
1.0000 mg | Freq: Once | INTRAMUSCULAR | Status: AC | PRN
  Administered 2017-06-14: 1 mg via INTRAVENOUS
  Filled 2017-06-14: qty 1

## 2017-06-14 NOTE — ED Notes (Addendum)
Patient now yelling, "help me" and sitting straight up in bed. Patient not following commands. Patient will not answer questions.

## 2017-06-14 NOTE — ED Provider Notes (Signed)
Jordan Gomez Provider Note   CSN: [id number] Arrival date & time: 06/14/17  1752     History   Chief Complaint Chief Complaint  Patient presents with  . unresponsive    HPI Jordan Gomez is a 27 y.o. male with a history of schizophrenia, schizoaffective disorder, adjustment disorder presenting by EMS for unresponsiveness.  EMS reports that bystanders called after the patient was noted to be lying on the ground for 3 hours not responding.  They report the patient would move his upper body with painful stimuli, but had an calm principal speech.  He was placed in a c-collar.  I was asked to evaluate the patient by nursing staff on arrival due to concern for unresponsiveness.  He appeared hemodynamically stable with heart rate in the 60s to 70s on the monitor with normal sinus rhythm.  Blood pressure 118/68.  SaO2 of 98% with good waveform on the monitor.  RR 16-18.  Pupils were noted to be equal round and reactive.  Patient initially did not seem to respond to painful stimuli.  He did not follow commands.  The patient was also evaluated by Dr. Eudelia Bunch, attending physician. When he was splashed with cold water, his eyes closed appropriately.   Approximately 20 minutes later, nursing staff documents that the patient was now yelling help me and sitting up straight in bed.  He continued not to follow commands.   No other history is able to be obtained.  Level 5 caveat secondary to altered mental status.  The history is provided by the patient. No language interpreter was used.    Past Medical History:  Diagnosis Date  . Schizo affective schizophrenia [facility])     Patient Active Problem List   Diagnosis Date Noted  . 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 Acute psychosis (HCC) 05/08/2017    History reviewed. No pertinent surgical history.      Home Medications    Prior to Admission medications   Medication Sig Start Date End  Date Taking? Authorizing Provider  benztropine (COGENTIN) 1 MG tablet Take 1 tablet (1 mg total) by mouth 2 (two) times daily as needed (EPS). 06/06/17  Yes Nwoko, Nicole Kindred I, NP  diphenhydrAMINE (BENADRYL) 25 MG tablet Take 1 tablet (25 mg total) by mouth every 8 (eight) hours for 7 days. 06/11/17 06/18/17 Yes Harris, Abigail, PA-C  haloperidol (HALDOL) 5 MG tablet Take 1 tablet (5 mg total) by mouth 2 (two) times daily. For mood control 06/06/17  Yes Nwoko, Nicole Kindred I, NP  haloperidol decanoate (HALDOL DECANOATE) 100 MG/ML injection Inject 1 mL (100 mg total) into the muscle every 30 (thirty) days. (Due on 07-04-17): For mood control 07/04/17  Yes Armandina Stammer I, NP  hydrOXYzine (ATARAX/VISTARIL) 50 MG tablet Take 1 tablet (50 mg total) by mouth every 6 (six) hours as needed (mild/moderate anxiety). 06/06/17  Yes Armandina Stammer I, NP  benztropine (COGENTIN) 1 MG tablet Take 1 tablet (1 mg total) by mouth 2 (two) times daily. 06/09/17   Arby Barrette, MD  traZODone (DESYREL) 50 MG tablet Take 1 tablet (50 mg total) by mouth at bedtime as needed for sleep. 06/06/17   Sanjuana Kava, NP    Family History Family History  Family history unknown: Yes    Social History Social History   Tobacco Use  . Smoking status: Former Smoker    Types: Cigarettes  . Smokeless tobacco: Never Used  . Tobacco comment: refused  Substance Use Topics  . Alcohol use:  Yes    Frequency: Never    Comment: seldom  . Drug use: Yes    Types: Marijuana    Comment: occasionally     Allergies   Patient has no known allergies.   Review of Systems Review of Systems  Unable to perform ROS: Mental status change     Physical Exam Updated Vital Signs BP (!) 105/57 (BP Location: Right Arm)   Pulse 67   Resp 16   Ht  (1.651 m)   Wt 56.7 kg (125 lb)   SpO2 99%   BMI 20.80 kg/m   Physical Exam  Constitutional: No distress.  HENT:  Head: Normocephalic and atraumatic.  Mouth/Throat: Oropharynx is clear and moist.    Drooling noted.  Eyes: Pupils are equal, round, and reactive to light.  Neck: Neck supple.  Cardiovascular: Normal rate, regular rhythm, normal heart sounds and intact distal pulses. Exam reveals no gallop and no friction rub.  No murmur heard. Radial, DP, PT pulses are 2+ and symmetric.  Pulmonary/Chest: Effort normal.  Breath sounds are equal and clear throughout.  Symmetric expansion with inspiration.   Abdominal: Bowel sounds are normal.  Abdomen is soft, nondistended.  Normoactive bowel sounds throughout.  Musculoskeletal: He exhibits no edema or deformity.  Neurological:  Moves all 4 extremities.  Does not follow commands.  Normal tone.  Skin: Skin is warm and dry. Capillary refill takes less than 2 seconds. No rash noted. He is not diaphoretic.  There is a large scaling plaque to the left anterior leg.  No surrounding erythema, edema, warmth.  Appears chronic.  Nursing note and vitals reviewed.  ED Treatments / Results  Labs (all labs ordered are listed, but only abnormal results are displayed) Labs Reviewed  RAPID URINE DRUG SCREEN, HOSP PERFORMED - Abnormal; Notable for the following components:      Result Value   Tetrahydrocannabinol POSITIVE (*)    All other components within normal limits  ETHANOL - Abnormal; Notable for the following components:   Alcohol, Ethyl (B) 203 (*)    All other components within normal limits  COMPREHENSIVE METABOLIC PANEL - Abnormal; Notable for the following components:   Glucose, Bld 101 (*)    Calcium 8.3 (*)    ALT 13 (*)    All other components within normal limits  ACETAMINOPHEN LEVEL - Abnormal; Notable for the following components:   Acetaminophen (Tylenol), Serum <10 (*)    All other components within normal limits  CBC WITH DIFFERENTIAL/PLATELET  LIPASE, BLOOD  SALICYLATE LEVEL  CBG MONITORING, ED    EKG None  Radiology Ct Head Wo Contrast  Result Date: 06/14/2017 CLINICAL DATA:  Found unresponsive in street, initial  encounter EXAM: CT HEAD WITHOUT CONTRAST CT CERVICAL SPINE WITHOUT CONTRAST TECHNIQUE: Multidetector CT imaging of the head and cervical spine was performed following the standard protocol without intravenous contrast. Multiplanar CT image reconstructions of the cervical spine were also generated. COMPARISON:  None. FINDINGS: CT HEAD FINDINGS Brain: No evidence of acute infarction, hemorrhage, hydrocephalus, extra-axial collection or mass lesion/mass effect. Vascular: No hyperdense vessel or unexpected calcification. Skull: Normal. Negative for fracture or focal lesion. Sinuses/Orbits: No acute finding. Other: None. CT CERVICAL SPINE FINDINGS Alignment: Within normal limits. Skull base and vertebrae: 7 cervical segments are well visualized. Vertebral body height is well maintained. No acute facet abnormality or acute fracture is seen. Soft tissues and spinal canal: No prevertebral fluid or swelling. No visible canal hematoma. Upper chest: Within normal limits. Other: None IMPRESSION:  CT of the head: No acute intracranial abnormality noted. CT of cervical spine: No acute abnormality noted. Electronically Signed   By: Alcide Clever M.D.   On: 06/14/2017 19:44   Ct Cervical Spine Wo Contrast  Result Date: 06/14/2017 CLINICAL DATA:  Found unresponsive in street, initial encounter EXAM: CT HEAD WITHOUT CONTRAST CT CERVICAL SPINE WITHOUT CONTRAST TECHNIQUE: Multidetector CT imaging of the head and cervical spine was performed following the standard protocol without intravenous contrast. Multiplanar CT image reconstructions of the cervical spine were also generated. COMPARISON:  None. FINDINGS: CT HEAD FINDINGS Brain: No evidence of acute infarction, hemorrhage, hydrocephalus, extra-axial collection or mass lesion/mass effect. Vascular: No hyperdense vessel or unexpected calcification. Skull: Normal. Negative for fracture or focal lesion. Sinuses/Orbits: No acute finding. Other: None. CT CERVICAL SPINE FINDINGS  Alignment: Within normal limits. Skull base and vertebrae: 7 cervical segments are well visualized. Vertebral body height is well maintained. No acute facet abnormality or acute fracture is seen. Soft tissues and spinal canal: No prevertebral fluid or swelling. No visible canal hematoma. Upper chest: Within normal limits. Other: None IMPRESSION: CT of the head: No acute intracranial abnormality noted. CT of cervical spine: No acute abnormality noted. Electronically Signed   By: Alcide Clever M.D.   On: 06/14/2017 19:44    Procedures Procedures (including critical care time)  Medications Ordered in ED Medications  LORazepam (ATIVAN) injection 1 mg (1 mg Intravenous Given 06/14/17 2038)     Initial Impression / Assessment and Plan / ED Course  I have reviewed the triage vital signs and the nursing notes.  Pertinent labs & imaging results that were available during my care of the patient were reviewed by me and considered in my medical decision making (see chart for details).     27 year old male with a history of schizophrenia, schizoaffective disorder, and psychosis who has been evaluated in the ED 17 times over the last 6 months presenting with concern for unresponsiveness.  He has a history of noncompliance with treatment.  He was evaluated by myself and Dr. Eudelia Bunch, attending physician.   On initial examination, he appears hemodynamically stable and flinches in response to cold water.  Labs are notable for ethanol level of 203 and UDS positive for THC.  Labs are otherwise reassuring.  CT head and cervical spine are negative.  C-collar was removed by me.  Ativan given for agitation.  On reevaluation, the patient has gotten out of bed and is able to bear weight on the bilateral lower extremities.  He mumbles in response to questions, but speech is unintelligible.  Good strength against resistance of the bilateral upper and lower extremities.  He is ambulatory.  NAD.  Drooling is also noted, which  the patient has previously been evaluated for several times in the ED.  Will plan for TTS consult after the patient is clinically sober. Pt medically cleared at this time. Psych hold orders.  TTS consult pending; please see psych team notes for further documentation of care/dispo. Pt stable at time of med clearance. Patient care transferred to PA Caccavale at the end of my shift. Patient presentation, ED course, and plan of care discussed with review of all pertinent labs and imaging. Please see his/her note for further details regarding further ED course and disposition.  Final Clinical Impressions(s) / ED Diagnoses   Final diagnoses:  Alcoholic intoxication with complication [facility])    ED Discharge Orders    None       Sharan Mcenaney A, PA-C  06/15/17 0040    Nira Conn, MD 06/21/17 1401

## 2017-06-14 NOTE — ED Notes (Signed)
Patient up on all four extremities and turned around backwards in stretcher. Seizure pads are on stretcher to prevent patient from falling out. Tech helped turn patient around and he is now laying in the right direction on the stretcher and calm.

## 2017-06-14 NOTE — ED Notes (Signed)
Patient will respond to pain with limited movement.

## 2017-06-14 NOTE — ED Notes (Addendum)
Patient is now calm and in a contractured position with arms.

## 2017-06-14 NOTE — ED Provider Notes (Signed)
Pt signed out to me by Marigene Ehlers, PA-C. Please see previous notes for further history.   In brief, pt presenting due to concerns by bystanders for being unresponsive.  Patient has not been unresponsive with any medical providers.  Labs show patient with elevated ethanol levels.  Plan for patient to sober up and then have TTS evaluation.  1210: On reassessment, patient awake and responding to questions, although answers are sluggish.  He is moving all extremities on command. Will try TTS evaluation again.   TTS pending. PA-C for oncoming shift aware pt is pending dispo.    Alveria Apley, PA-C 06/15/17 1610    Dione Booze, MD 06/15/17 0730

## 2017-06-14 NOTE — BH Assessment (Signed)
[facility] Assessment Progress Note   Patient sleeping from being given IM Ativan at 20:38.  TTS to do assessment once patient alert and oriented.

## 2017-06-14 NOTE — ED Triage Notes (Signed)
EMS reports by standers called after pt lying on street 3 hours not responding. Will move upper body with painful stimuli, can not understand speech, c collar on IV # 16 rt fa

## 2017-06-14 NOTE — ED Notes (Signed)
Bed: WA20 Expected date: 06/14/17 Expected time: 5:43 PM Means of arrival: Ambulance Comments: Unresponsive

## 2017-06-15 DIAGNOSIS — F1014 Alcohol abuse with alcohol-induced mood disorder: Secondary | ICD-10-CM | POA: Diagnosis present

## 2017-06-15 MED ORDER — TRAZODONE HCL 50 MG PO TABS: 50.0000 mg | ORAL_TABLET | Freq: Every evening | ORAL | Status: DC | PRN

## 2017-06-15 MED ORDER — HALOPERIDOL 5 MG PO TABS
5.0000 mg | ORAL_TABLET | Freq: Two times a day (BID) | ORAL | Status: DC
  Administered 2017-06-15: 5 mg via ORAL
  Filled 2017-06-15: qty 1

## 2017-06-15 MED ORDER — GABAPENTIN 300 MG PO CAPS
300.0000 mg | ORAL_CAPSULE | Freq: Two times a day (BID) | ORAL | Status: DC
  Administered 2017-06-15: 300 mg via ORAL
  Filled 2017-06-15: qty 1

## 2017-06-15 MED ORDER — BENZTROPINE MESYLATE 1 MG PO TABS
1.0000 mg | ORAL_TABLET | Freq: Two times a day (BID) | ORAL | Status: DC
  Administered 2017-06-15: 1 mg via ORAL
  Filled 2017-06-15: qty 1

## 2017-06-15 NOTE — [facility] Suicide Risk Assessment (Cosign Needed)
Suicide Risk Assessment  Discharge Assessment   [facility] Discharge Suicide Risk Assessment   Principal Problem: Alcohol abuse with alcohol-induced mood disorder [facility]) Discharge Diagnoses:  Patient Active Problem List   Diagnosis Date Noted  . Alcohol abuse with alcohol-induced mood disorder (HCC) [F10.14] 06/15/2017  . 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 Acute psychosis (HCC) [F23] 05/08/2017    Pt denies suicidal/homicidal ideation, denies auditory/visual hallucinations and does not appear to be responding to internal stimuli. Pt stated he doesn't know what is wrong with him but he does not need psychiatric help. Pt admits to drinking alcohol and smoking weed. Pt's UDS positive for THC, BAL 203. Pt stated he is homeless and does not work but drinks with friends. Pt stated this is a "group effort" and they all chip in money for the alcohol. Pt has had 17 ED visits in the past 6 months for the same presentation. Pt will be seen by Peer support for substance abuse resources in the community. Pt refused outpatient resources for mental health services and kept repeating "I don't need psychiatric help." Pt is stable and psychiatrically clear for discharge.   Total Time spent with patient: 30 minutes  Musculoskeletal: Strength & Muscle Tone: within normal limits Gait & Station: normal Patient leans: N/A  Psychiatric Specialty Exam:   Blood pressure (!) 110/58, pulse 80, temperature 98.6 F (37 C), resp. rate 16, height  (1.651 m), weight 125 lb (56.7 kg), SpO2 98 %.Body mass index is 20.8 kg/m.  General Appearance: Casual  Eye Contact::  Fair  Speech:  Clear and Coherent409  Volume:  Normal  Mood:  Anxious  Affect:  Congruent  Thought Process:  Coherent and Linear  Orientation:  Full (Time, Place, and Person)  Thought Content:  Illogical  Suicidal Thoughts:  No  Homicidal Thoughts:  No  Memory:  Immediate;   Good Recent;   Good Remote;   Fair  Judgement:   Impaired  Insight:  Shallow  Psychomotor Activity:  Normal  Concentration:  Fair  Recall:  Good  Fund of Knowledge:Good  Language: Good  Akathisia:  No  Handed:  Right  AIMS (if indicated):     Assets:  Communication Skills Resilience  Sleep:     Cognition: WNL  ADL's:  Intact   Mental Status Per Nursing Assessment::   On Admission:   found unresponsive, lying on the ground while intoxicated  Demographic Factors:  Male, Adolescent or young adult, Low socioeconomic status and Unemployed  Loss Factors: Financial problems/change in socioeconomic status  Historical Factors: Impulsivity  Risk Reduction Factors:   Sense of responsibility to family  Continued Clinical Symptoms:  Depression:   Comorbid alcohol abuse/dependence Impulsivity Alcohol/Substance Abuse/Dependencies  Cognitive Features That Contribute To Risk:  Closed-mindedness    Suicide Risk:  Minimal: No identifiable suicidal ideation.  Patients presenting with no risk factors but with morbid ruminations; may be classified as minimal risk based on the severity of the depressive symptoms    Plan Of Care/Follow-up recommendations:  Activity:  as tolerated Diet:  Heart healthy  Laveda Abbe, NP 06/15/2017, 12:24 PM

## 2017-06-15 NOTE — Patient Outreach (Signed)
[facility] met with the patient and provided substance use recovery support. Patient states that he believes his substance use is manageable and does not need any treatment at this time. Patient was interested in getting help with getting connected with a homeless shelter and [facility] will provide those resources for the patient. [facility] also encouraged the patient to attend some support group meetings for mental health and will provide information for [facility] mental health support group meetings. [facility] provided [facility] contact information and suggested to the patient to stay in contact with [facility] for further support or help with other mental health/substance use recovery resources.

## 2017-06-15 NOTE — ED Notes (Signed)
One patient belonging bag in locker 30, patient transferred to bed 30, sleeping the whole time.

## 2017-06-15 NOTE — BH Assessment (Signed)
Assessment Note  Jordan Gomez is an 27 y.o. male who was brought in by EMS.  He was found non-responsive/intoxicated out in the community. Patient states: "I don't know what id wrong with me.  I need someone to tell me what is wrong with me in order to get the help I need."  Patient states that he wants to be admitted to [facility].  When asked if he is suicidal/homicidal or psychotic, patient says he is not.  He asks, "What do I need to tell you to be admitted."  Explained to patient that he needed to tell me what is going on in his life, what he is feeling and what he is thinking.  Patient was not able to identify any issues that he has.  I told him that the psychiatrist would be in to evaluate him later this morning.  Patient states, "Tell him that I do not want to talk to him."  Patient states that he has been drinking forty ounce beers, but refused to identify how many, how often or how long.  He denied any other drug use and did not identify that he was having any withdrawal symptoms.  Patient denied any other drug use to this Clinical research associate, but evidently told his admitting nurse that he uses marijuana.  Patient was agitated, uncooperative and very guarded during his assessment.  He was alert and oriented x 3, but could not remember the circumstances that brought him here. He admits to drinking yesterday, but was very evasive when talking about his drinking.  Patient states that his sleep and appetite are good.  His recent memory was impaired to his situation, but remote memory was good.  His motor activity was within normal limits and his speech was clear and he maintained good eye contact.  Patient his homeless, unemployed and has minimal support in [place].  His closest family lives in [place].  Diagnosis: Schizophrenia F20.9 and Alcohol Use Disorder Severe F10.20  Past Medical History:  Past Medical History:  Diagnosis Date  . Schizo affective schizophrenia (HCC)     History reviewed. No  pertinent surgical history.  Family History:  Family History  Family history unknown: Yes    Social History:  reports that he has quit smoking. His smoking use included cigarettes. He has never used smokeless tobacco. He reports that he drinks alcohol. He reports that he has current or past drug history. Drug: Marijuana.  Additional Social History:  Alcohol / Drug Use Pain Medications: denies Prescriptions: denies Over the Counter: denies History of alcohol / drug use?: Yes Longest period of sobriety (when/how long): unknown, states that he only drinks on occasion Substance #1 Name of Substance 1: alcohol 1 - Age of First Use: "I don't know" 1 - Amount (size/oz): "I drink forties, I don't know how many" 1 - Frequency: "occasionally" 1 - Duration: unknown 1 - Last Use / Amount: "not sure" Substance #2 Name of Substance 2: marijuana 2 - Age of First Use: "unsure" 2 - Amount (size/oz): unkknown 2 - Frequency: unknown 2 - Duration: unknown 2 - Last Use / Amount: unknown  CIWA: CIWA-Ar BP: (!) 110/58 Pulse Rate: 80 COWS:    Allergies: No Known Allergies  Home Medications:  (Not in a hospital admission)  OB/GYN Status:  No LMP for male patient.  General Assessment Data Assessment unable to be completed: Yes Reason for not completing assessment: Patient sedated. Location of Assessment: [facility] ED TTS Assessment: In system Is this a Tele or Face-to-Face Assessment?:  Face-to-Face Is this an Initial Assessment or a Re-assessment for this encounter?: Initial Assessment Marital status: Single Living Arrangements: Other (Comment)(homeless) Can pt return to current living arrangement?: Yes Admission Status: Voluntary Is patient capable of signing voluntary admission?: Yes Referral Source: Self/Family/Friend Insurance type: (self-pay)     Crisis Care Plan Living Arrangements: Other (Comment)(homeless) Legal Guardian: Other:(self) Name of Psychiatrist: none Name of  Therapist: (none)  Education Status Is patient currently in school?: No Highest grade of school patient has completed: (12) Is the patient employed, unemployed or receiving disability?: Unemployed  Risk to self with the past 6 months Suicidal Ideation: No Has patient been a risk to self within the past 6 months prior to admission? : No Suicidal Intent: No Has patient had any suicidal intent within the past 6 months prior to admission? : No Is patient at risk for suicide?: No Suicidal Plan?: No Has patient had any suicidal plan within the past 6 months prior to admission? : No Access to Means: No What has been your use of drugs/alcohol within the last 12 months?: (states that he uses occasionally) Previous Attempts/Gestures: No How many times?: (none) Other Self Harm Risks: (none reported) Triggers for Past Attempts: None known Intentional Self Injurious Behavior: None Family Suicide History: No Persecutory voices/beliefs?: No Depression: No Substance abuse history and/or treatment for substance abuse?: No Suicide prevention information given to non-admitted patients: Not applicable  Risk to Others within the past 6 months Homicidal Ideation: No Does patient have any lifetime risk of violence toward others beyond the six months prior to admission? : No Thoughts of Harm to Others: No Current Homicidal Intent: No Current Homicidal Plan: No Access to Homicidal Means: No Identified Victim: (none) History of harm to others?: No Assessment of Violence: None Noted Violent Behavior Description: (none) Does patient have access to weapons?: No Criminal Charges Pending?: No Does patient have a court date: No Is patient on probation?: Unknown  Psychosis Hallucinations: None noted Delusions: None noted  Mental Status Report Appearance/Hygiene: Disheveled Eye Contact: Good Motor Activity: Agitation, Freedom of movement Speech: Argumentative Level of Consciousness:  Irritable Mood: Irritable Affect: Irritable, Flat Anxiety Level: Moderate Thought Processes: Coherent, Relevant Judgement: Impaired Orientation: Person, Place, Time, Situation Obsessive Compulsive Thoughts/Behaviors: None  Cognitive Functioning Concentration: Normal Memory: Unable to Assess Is patient IDD: No Is patient DD?: No Insight: Poor Impulse Control: Unable to Assess Appetite: Good Have you had any weight changes? : No Change Amount of the weight change? (lbs): (none reported) Sleep: No Change Total Hours of Sleep: (unknown)  ADLScreening [facility] Assessment Services) Patient's cognitive ability adequate to safely complete daily activities?: Yes Patient able to express need for assistance with ADLs?: Yes Independently performs ADLs?: Yes (appropriate for developmental age)  Prior Inpatient Therapy Prior Inpatient Therapy: Yes Prior Therapy Dates: (April 2019) Prior Therapy Facilty/Provider(s): [facility]) Reason for Treatment: (Schizophrenia)  Prior Outpatient Therapy Prior Outpatient Therapy: No Does patient have an ACCT team?: No Does patient have [facility] services? : No Does patient have P4[facility] services?: No  ADL Screening (condition at time of admission) Patient's cognitive ability adequate to safely complete daily activities?: Yes Is the patient deaf or have difficulty hearing?: No Does the patient have difficulty seeing, even when wearing glasses/contacts?: No Does the patient have difficulty concentrating, remembering, or making decisions?: No Patient able to express need for assistance with ADLs?: Yes Does the patient have difficulty dressing or bathing?: No Independently performs ADLs?: Yes (appropriate for developmental age) Does the patient have difficulty walking  or climbing stairs?: No Weakness of Legs: None Weakness of Arms/Hands: None       Abuse/Neglect Assessment (Assessment to be complete while patient is alone) Abuse/Neglect Assessment Can Be  Completed: Yes Physical Abuse: Denies Verbal Abuse: Denies Sexual Abuse: Denies Exploitation of patient/patient's resources: Denies Self-Neglect: Denies Values / Beliefs Cultural Requests During Hospitalization: None Spiritual Requests During Hospitalization: None Consults Spiritual Care Consult Needed: No Social Work Consult Needed: No Merchant navy officer (For Healthcare) Does Patient Have a Medical Advance Directive?: No Would patient like information on creating a medical advance directive?: No - Patient declined    Additional Information 1:1 In Past 12 Months?: No CIRT Risk: No Elopement Risk: No Does patient have medical clearance?: No     Disposition: Per Elta Guadeloupe, NP, Patient can be discharged with outpatient resources, he does not meet admission criteria Disposition Initial Assessment Completed for this Encounter: Yes Disposition of Patient: Discharge Type of inpatient treatment program: Adult Patient refused recommended treatment: No Mode of transportation if patient is discharged?: Walking Patient referred to: (patient was given outpatient resources)  On Site Evaluation by:   Reviewed with Physician:    Arnoldo Lenis Billiejean Schimek 06/15/2017 8:55 AM

## 2017-09-09 ENCOUNTER — Inpatient Hospital Stay (HOSPITAL_COMMUNITY)
Admission: EM | Admit: 2017-09-09 | Discharge: 2017-09-10 | DRG: 896 | Payer: Self-pay | Attending: Pulmonary Disease | Admitting: Pulmonary Disease

## 2017-09-09 ENCOUNTER — Emergency Department (HOSPITAL_COMMUNITY): Payer: Self-pay

## 2017-09-09 DIAGNOSIS — R451 Restlessness and agitation: Secondary | ICD-10-CM | POA: Diagnosis present

## 2017-09-09 DIAGNOSIS — Y908 Blood alcohol level of 240 mg/100 ml or more: Secondary | ICD-10-CM | POA: Diagnosis present

## 2017-09-09 DIAGNOSIS — T50901A Poisoning by unspecified drugs, medicaments and biological substances, accidental (unintentional), initial encounter: Secondary | ICD-10-CM | POA: Diagnosis present

## 2017-09-09 DIAGNOSIS — E876 Hypokalemia: Secondary | ICD-10-CM | POA: Diagnosis present

## 2017-09-09 DIAGNOSIS — G92 Toxic encephalopathy: Secondary | ICD-10-CM | POA: Diagnosis present

## 2017-09-09 DIAGNOSIS — R4189 Other symptoms and signs involving cognitive functions and awareness: Secondary | ICD-10-CM

## 2017-09-09 DIAGNOSIS — R112 Nausea with vomiting, unspecified: Secondary | ICD-10-CM

## 2017-09-09 DIAGNOSIS — F10121 Alcohol abuse with intoxication delirium: Principal | ICD-10-CM | POA: Diagnosis present

## 2017-09-09 DIAGNOSIS — R160 Hepatomegaly, not elsewhere classified: Secondary | ICD-10-CM | POA: Diagnosis present

## 2017-09-09 LAB — CBG MONITORING, ED: Glucose-Capillary: 96 mg/dL (ref 70–99)

## 2017-09-09 MED ORDER — IOPAMIDOL (ISOVUE-300) INJECTION 61%
100.0000 mL | Freq: Once | INTRAVENOUS | Status: AC | PRN
  Administered 2017-09-10: 100 mL via INTRAVENOUS

## 2017-09-09 MED ORDER — PROPOFOL 1000 MG/100ML IV EMUL
INTRAVENOUS | Status: AC
  Administered 2017-09-09: 5 ug/kg/min
  Filled 2017-09-09: qty 100

## 2017-09-09 MED ORDER — ETOMIDATE 2 MG/ML IV SOLN
INTRAVENOUS | Status: AC | PRN
  Administered 2017-09-09: 20 mg via INTRAVENOUS

## 2017-09-09 MED ORDER — ROCURONIUM BROMIDE 50 MG/5ML IV SOLN
INTRAVENOUS | Status: AC | PRN
  Administered 2017-09-09: 100 mg via INTRAVENOUS

## 2017-09-09 NOTE — H&P (Deleted)
Surgical Consultation Requesting provider: Dr. Nicanor AlconPalumbo  CC: altered level of consciousness  HPI: Young man with unclear history- taken from other providers and EMS as patient arrives unconscious. He was behaving erratically in the road on [address] and was brought to the jail by the police. This was around 9pm. He was apparently combative and rude. At some point, he stated he needed to vomit. He was given a trash can which he kicked aside and vomited on the floor, immediatell after which he lost consciousness. Sternal rub was attempted without response. At this time EMS was contacted and he was brought [facility] ER. He is GCS 3 on arrival and mildly hypotensive (90s/50s), no tachycardia.   Unable to ascertain allergies, medications, medical/surgical/social/family history at this time due to patient unresponsive.   Social History   Socioeconomic History  . Marital status: Not on file    Spouse name: Not on file  . Number of children: Not on file  . Years of education: Not on file  . Highest education level: Not on file  Occupational History  . Not on file  Social Needs  . Financial resource strain: Not on file  . Food insecurity:    Worry: Not on file    Inability: Not on file  . Transportation needs:    Medical: Not on file    Non-medical: Not on file  Tobacco Use  . Smoking status: Not on file  Substance and Sexual Activity  . Alcohol use: Not on file  . Drug use: Not on file  . Sexual activity: Not on file  Lifestyle  . Physical activity:    Days per week: Not on file    Minutes per session: Not on file  . Stress: Not on file  Relationships  . Social connections:    Talks on phone: Not on file    Gets together: Not on file    Attends religious service: Not on file    Active member of club or organization: Not on file    Attends meetings of clubs or organizations: Not on file    Relationship status: Not on file  Other Topics Concern  . Not on file  Social History  Narrative  . Not on file    No current facility-administered medications on file prior to encounter.    No current outpatient medications on file prior to encounter.    Review of Systems: a complete, 10pt review of systems was unable to be completed due to patient mental status  Physical Exam: Vitals:   09/10/17 0030 09/10/17 0040  BP: 123/85 123/77  Pulse: 60 91  Resp: (!) 21 (!) 24  Temp:    SpO2: 100% 99%   Gen: Intubated Head: normocephalic, atraumatic. No evidence of trauma Eyes: sclera injected. Pupils were 10mm on arrival per EDP, now about 4mm Neck: trachea midline, no crepitus or hematoma Chest: symmetrical air entry, clear bilaterally. There is a superficial abrasion to the sternum, reportedly from sternal rubbing  Cardiovascular: RRR with palpable distal pulses, no pedal edema Abdomen: soft, nondistended. No mass or organomegaly.  Extremities: warm, without edema, no deformities. No crepitus or swelling along long bones or hands/feet Neuro: GCS 3 Psych: unable to assess due to mental status Skin: warm and dry   CBC Latest Ref Rng & Units 09/10/2017 09/09/2017  WBC 4.0 - 10.5 K/uL - 6.8  Hemoglobin 13.0 - 17.0 g/dL 16.113.6 09.613.6  Hematocrit 04.539.0 - 52.0 % 40.0 39.6  Platelets 150 - 400  K/uL - 214    CMP Latest Ref Rng & Units 09/10/2017  Glucose 70 - 99 mg/dL 98  BUN 8 - 23 mg/dL 21  Creatinine 2.13 - 0.86 mg/dL 5.78(I)  Sodium 696 - 295 mmol/L 142  Potassium 3.5 - 5.1 mmol/L 3.3(L)  Chloride 98 - 111 mmol/L 107    Lab Results  Component Value Date   INR 1.14 09/09/2017    Imaging: Ct Head Wo Contrast  Result Date: 09/10/2017 CLINICAL DATA:  Patient found down. Vomiting. Concern for head or cervical spine injury. EXAM: CT HEAD WITHOUT CONTRAST CT CERVICAL SPINE WITHOUT CONTRAST TECHNIQUE: Multidetector CT imaging of the head and cervical spine was performed following the standard protocol without intravenous contrast. Multiplanar CT image reconstructions of the  cervical spine were also generated. COMPARISON:  None. FINDINGS: CT HEAD FINDINGS Brain: No evidence of acute infarction, hemorrhage, hydrocephalus, extra-axial collection or mass lesion/mass effect. The posterior fossa, including the cerebellum, brainstem and fourth ventricle, is within normal limits. The third and lateral ventricles, and basal ganglia are unremarkable in appearance. The cerebral hemispheres are symmetric in appearance, with normal gray-white differentiation. No mass effect or midline shift is seen. Vascular: No hyperdense vessel or unexpected calcification. Skull: There is no evidence of fracture; visualized osseous structures are unremarkable in appearance. Sinuses/Orbits: The visualized portions of the orbits are within normal limits. The paranasal sinuses and mastoid air cells are well-aerated. Other: No significant soft tissue abnormalities are seen. CT CERVICAL SPINE FINDINGS Alignment: Normal. Skull base and vertebrae: No acute fracture. No primary bone lesion or focal pathologic process. Soft tissues and spinal canal: No prevertebral fluid or swelling. No visible canal hematoma. Disc levels: Intervertebral disc spaces are preserved. The bony foramina are grossly unremarkable Upper chest: The visualized lung apices are clear. The thyroid gland is unremarkable. The endotracheal tube and nasogastric tube are partially visualized. Other: No additional soft tissue abnormalities are seen. IMPRESSION: 1. No evidence of traumatic intracranial injury or fracture. 2. No evidence of fracture or subluxation along the cervical spine. Electronically Signed   By: Roanna Raider M.D.   On: 09/10/2017 00:32   Ct Chest W Contrast  Result Date: 09/10/2017 CLINICAL DATA:  Patient found down in became combative when being transported jail. Patient vomited at the scene. EXAM: CT CHEST, ABDOMEN, AND PELVIS WITH CONTRAST TECHNIQUE: Multidetector CT imaging of the chest, abdomen and pelvis was performed  following the standard protocol during bolus administration of intravenous contrast. CONTRAST:  ISOVUE-300 IOPAMIDOL (ISOVUE-300) INJECTION 61% COMPARISON:  None. FINDINGS: CT CHEST FINDINGS CARDIOVASCULAR: Conventional branch pattern of the great vessels without stenosis. Nonaneurysmal thoracic aorta. No large central pulmonary embolus. Normal heart size without pericardial effusion. MEDIASTINUM/NODES: Unremarkable thyroid gland. No lymphadenopathy by CT size criteria. Patent trachea and mainstem bronchi. Endotracheal tube tip terminates at the level of the aortic arch. Gastric tube is noted in the esophagus extending into the stomach. CT appearance of the esophagus is unremarkable. No hilar lymphadenopathy is noted. LUNGS/PLEURA: Minimal bibasilar atelectasis more so along the medial aspect of the left lower lobe and posterior aspect of the right lower lobe. No effusion or pneumothorax. No dominant mass. MUSCULOSKELETAL: The included soft tissues and osseous structures appear intact. No acute osseous appearing abnormality is noted. CT ABDOMEN AND PELVIS FINDINGS HEPATIC BILIARY: Hepatomegaly with a liver measuring up to 18.9 cm midclavicular line with periportal edema, question hepatic injury from toxins, viral or alcoholic hepatitis among some considerations. No space-occupying mass of the liver. The gallbladder is  physiologically distended without stones. PANCREAS: No pancreatic mass, inflammation or ductal dilatation. SPLEEN: Normal size spleen without mass. ADRENALS/URINARY TRACT: Normal bilateral adrenal glands and kidneys. No enhancing mass lesion of either kidney. No nephrolithiasis nor obstructive uropathy. The ureters are not dilated. No hydroureteronephrosis is noted. The bladder is unremarkable for the degree of distention without focal mural thickening or calculi. STOMACH/BOWEL: The stomach, small and large bowel are normal in course and caliber without mural thickening or inflammation. No  mechanical bowel obstruction is noted. The increased colonic stool burden. Normal air-filled appendix. VASCULAR/LYMPHATIC: Nonaneurysmal thoracic aorta. No lymphadenopathy by CT size criteria. REPRODUCTIVE: Normal prostate and seminal vesicles. OTHER: No free air nor free fluid. MUSCULOSKELETAL: Nonacute. IMPRESSION: CT chest: 1. Satisfactory support line and tube positions with endotracheal tube tip terminating at the level of the aortic arch, above the carina and gastric tube extending into the stomach. 2. No active cardiopulmonary disease. Bibasilar atelectasis is noted. 3. No large central pulmonary embolus. 4. Unremarkable aorta without dissection or aneurysm. CT AP: 1. Hepatomegaly with periportal edema, nonspecific but can be seen in hepatitis possibly alcoholic, viral or secondary to toxins. 2. Increased colonic stool burden query constipation. No bowel obstruction or inflammation. Electronically Signed   By: Tollie Eth M.D.   On: 09/10/2017 00:36   Ct Cervical Spine Wo Contrast  Result Date: 09/10/2017 CLINICAL DATA:  Patient found down. Vomiting. Concern for head or cervical spine injury. EXAM: CT HEAD WITHOUT CONTRAST CT CERVICAL SPINE WITHOUT CONTRAST TECHNIQUE: Multidetector CT imaging of the head and cervical spine was performed following the standard protocol without intravenous contrast. Multiplanar CT image reconstructions of the cervical spine were also generated. COMPARISON:  None. FINDINGS: CT HEAD FINDINGS Brain: No evidence of acute infarction, hemorrhage, hydrocephalus, extra-axial collection or mass lesion/mass effect. The posterior fossa, including the cerebellum, brainstem and fourth ventricle, is within normal limits. The third and lateral ventricles, and basal ganglia are unremarkable in appearance. The cerebral hemispheres are symmetric in appearance, with normal gray-white differentiation. No mass effect or midline shift is seen. Vascular: No hyperdense vessel or unexpected  calcification. Skull: There is no evidence of fracture; visualized osseous structures are unremarkable in appearance. Sinuses/Orbits: The visualized portions of the orbits are within normal limits. The paranasal sinuses and mastoid air cells are well-aerated. Other: No significant soft tissue abnormalities are seen. CT CERVICAL SPINE FINDINGS Alignment: Normal. Skull base and vertebrae: No acute fracture. No primary bone lesion or focal pathologic process. Soft tissues and spinal canal: No prevertebral fluid or swelling. No visible canal hematoma. Disc levels: Intervertebral disc spaces are preserved. The bony foramina are grossly unremarkable Upper chest: The visualized lung apices are clear. The thyroid gland is unremarkable. The endotracheal tube and nasogastric tube are partially visualized. Other: No additional soft tissue abnormalities are seen. IMPRESSION: 1. No evidence of traumatic intracranial injury or fracture. 2. No evidence of fracture or subluxation along the cervical spine. Electronically Signed   By: Roanna Raider M.D.   On: 09/10/2017 00:32   Ct Abdomen Pelvis W Contrast  Result Date: 09/10/2017 CLINICAL DATA:  Patient found down in became combative when being transported jail. Patient vomited at the scene. EXAM: CT CHEST, ABDOMEN, AND PELVIS WITH CONTRAST TECHNIQUE: Multidetector CT imaging of the chest, abdomen and pelvis was performed following the standard protocol during bolus administration of intravenous contrast. CONTRAST:  ISOVUE-300 IOPAMIDOL (ISOVUE-300) INJECTION 61% COMPARISON:  None. FINDINGS: CT CHEST FINDINGS CARDIOVASCULAR: Conventional branch pattern of the great vessels without stenosis. Nonaneurysmal  thoracic aorta. No large central pulmonary embolus. Normal heart size without pericardial effusion. MEDIASTINUM/NODES: Unremarkable thyroid gland. No lymphadenopathy by CT size criteria. Patent trachea and mainstem bronchi. Endotracheal tube tip terminates at the level of  the aortic arch. Gastric tube is noted in the esophagus extending into the stomach. CT appearance of the esophagus is unremarkable. No hilar lymphadenopathy is noted. LUNGS/PLEURA: Minimal bibasilar atelectasis more so along the medial aspect of the left lower lobe and posterior aspect of the right lower lobe. No effusion or pneumothorax. No dominant mass. MUSCULOSKELETAL: The included soft tissues and osseous structures appear intact. No acute osseous appearing abnormality is noted. CT ABDOMEN AND PELVIS FINDINGS HEPATIC BILIARY: Hepatomegaly with a liver measuring up to 18.9 cm midclavicular line with periportal edema, question hepatic injury from toxins, viral or alcoholic hepatitis among some considerations. No space-occupying mass of the liver. The gallbladder is physiologically distended without stones. PANCREAS: No pancreatic mass, inflammation or ductal dilatation. SPLEEN: Normal size spleen without mass. ADRENALS/URINARY TRACT: Normal bilateral adrenal glands and kidneys. No enhancing mass lesion of either kidney. No nephrolithiasis nor obstructive uropathy. The ureters are not dilated. No hydroureteronephrosis is noted. The bladder is unremarkable for the degree of distention without focal mural thickening or calculi. STOMACH/BOWEL: The stomach, small and large bowel are normal in course and caliber without mural thickening or inflammation. No mechanical bowel obstruction is noted. The increased colonic stool burden. Normal air-filled appendix. VASCULAR/LYMPHATIC: Nonaneurysmal thoracic aorta. No lymphadenopathy by CT size criteria. REPRODUCTIVE: Normal prostate and seminal vesicles. OTHER: No free air nor free fluid. MUSCULOSKELETAL: Nonacute. IMPRESSION: CT chest: 1. Satisfactory support line and tube positions with endotracheal tube tip terminating at the level of the aortic arch, above the carina and gastric tube extending into the stomach. 2. No active cardiopulmonary disease. Bibasilar atelectasis is  noted. 3. No large central pulmonary embolus. 4. Unremarkable aorta without dissection or aneurysm. CT AP: 1. Hepatomegaly with periportal edema, nonspecific but can be seen in hepatitis possibly alcoholic, viral or secondary to toxins. 2. Increased colonic stool burden query constipation. No bowel obstruction or inflammation. Electronically Signed   By: Tollie Eth M.D.   On: 09/10/2017 00:36   Dg Chest Port 1 View  Result Date: 09/09/2017 CLINICAL DATA:  Trauma patient. EXAM: PORTABLE CHEST 1 VIEW COMPARISON:  No priors. FINDINGS: An endotracheal tube is in place with tip 1.3 cm above the carina. Nasogastric tube extends into the stomach. Lung volumes are low. No consolidative airspace disease. No pleural effusions. No pneumothorax. No pulmonary nodule or mass noted. Pulmonary vasculature and the cardiomediastinal silhouette are within normal limits. IMPRESSION: 1. Support apparatus, as above. Endotracheal tube is in a low position and could be withdrawn 3 cm for more optimal placement. 2. Low lung volumes without radiographic evidence of acute cardiopulmonary disease. Electronically Signed   By: Trudie Reed M.D.   On: 09/09/2017 23:59      A/P: Young man likely intoxicated, possible aspiration, unclear what events transpired before he was taken into custody. There is no evidence of traumatic injury on his physical exam or chest xray. CT head, c-spine and chest/abdomen/pelvis are negative for traumatic injury, but do reveal constipation and hepatomegaly/periportal edema of undetermined etiology.  Recommend medical admission for resuscitation and ongoing evaluation. Trauma will sign off. Please contact us if we can be of any help.    Phylliss Blakes, MD [facility], [place] Pager [phone]

## 2017-09-10 ENCOUNTER — Encounter (HOSPITAL_COMMUNITY): Payer: Self-pay | Admitting: Emergency Medicine

## 2017-09-10 ENCOUNTER — Encounter (HOSPITAL_COMMUNITY): Payer: Self-pay | Admitting: *Deleted

## 2017-09-10 ENCOUNTER — Other Ambulatory Visit: Payer: Self-pay

## 2017-09-10 DIAGNOSIS — T50901A Poisoning by unspecified drugs, medicaments and biological substances, accidental (unintentional), initial encounter: Secondary | ICD-10-CM | POA: Diagnosis present

## 2017-09-10 DIAGNOSIS — T50904A Poisoning by unspecified drugs, medicaments and biological substances, undetermined, initial encounter: Secondary | ICD-10-CM

## 2017-09-10 DIAGNOSIS — N179 Acute kidney failure, unspecified: Secondary | ICD-10-CM

## 2017-09-10 DIAGNOSIS — E876 Hypokalemia: Secondary | ICD-10-CM

## 2017-09-10 DIAGNOSIS — F10929 Alcohol use, unspecified with intoxication, unspecified: Secondary | ICD-10-CM

## 2017-09-10 DIAGNOSIS — G9340 Encephalopathy, unspecified: Secondary | ICD-10-CM

## 2017-09-10 LAB — PREPARE FRESH FROZEN PLASMA
Unit division: 0
Unit division: 0

## 2017-09-10 LAB — TYPE AND SCREEN
ABO/RH(D): O POS
Antibody Screen: NEGATIVE
UNIT DIVISION: 0
Unit division: 0

## 2017-09-10 LAB — URINALYSIS, ROUTINE W REFLEX MICROSCOPIC
Bacteria, UA: NONE SEEN
Bilirubin Urine: NEGATIVE
GLUCOSE, UA: NEGATIVE mg/dL
KETONES UR: NEGATIVE mg/dL
Leukocytes, UA: NEGATIVE
Nitrite: NEGATIVE
PH: 6 (ref 5.0–8.0)
Protein, ur: NEGATIVE mg/dL
Specific Gravity, Urine: 1.032 — ABNORMAL HIGH (ref 1.005–1.030)

## 2017-09-10 LAB — I-STAT CHEM 8, ED
BUN: 21 mg/dL (ref 8–23)
CALCIUM ION: 0.93 mmol/L — AB (ref 1.15–1.40)
Chloride: 107 mmol/L (ref 98–111)
Creatinine, Ser: 1.3 mg/dL — ABNORMAL HIGH (ref 0.61–1.24)
GLUCOSE: 98 mg/dL (ref 70–99)
HCT: 40 % (ref 39.0–52.0)
HEMOGLOBIN: 13.6 g/dL (ref 13.0–17.0)
Potassium: 3.3 mmol/L — ABNORMAL LOW (ref 3.5–5.1)
Sodium: 142 mmol/L (ref 135–145)
TCO2: 20 mmol/L — ABNORMAL LOW (ref 22–32)

## 2017-09-10 LAB — I-STAT ARTERIAL BLOOD GAS, ED
ACID-BASE DEFICIT: 5 mmol/L — AB (ref 0.0–2.0)
ACID-BASE DEFICIT: 6 mmol/L — AB (ref 0.0–2.0)
BICARBONATE: 18.6 mmol/L — AB (ref 20.0–28.0)
Bicarbonate: 19 mmol/L — ABNORMAL LOW (ref 20.0–28.0)
O2 SAT: 100 %
O2 SAT: 100 %
PO2 ART: 201 mmHg — AB (ref 83.0–108.0)
PO2 ART: 215 mmHg — AB (ref 83.0–108.0)
Patient temperature: 96.7
Patient temperature: 96.7
TCO2: 20 mmol/L — ABNORMAL LOW (ref 22–32)
TCO2: 20 mmol/L — ABNORMAL LOW (ref 22–32)
pCO2 arterial: 28.6 mmHg — ABNORMAL LOW (ref 32.0–48.0)
pCO2 arterial: 32.1 mmHg (ref 32.0–48.0)
pH, Arterial: 7.377 (ref 7.350–7.450)
pH, Arterial: 7.417 (ref 7.350–7.450)

## 2017-09-10 LAB — CBC
HCT: 39.6 % (ref 39.0–52.0)
HEMATOCRIT: 37.4 % — AB (ref 39.0–52.0)
Hemoglobin: 13.2 g/dL (ref 13.0–17.0)
Hemoglobin: 13.6 g/dL (ref 13.0–17.0)
MCH: 29.7 pg (ref 26.0–34.0)
MCH: 29.1 pg (ref 26.0–34.0)
MCHC: 34.3 g/dL (ref 30.0–36.0)
MCHC: 35.3 g/dL (ref 30.0–36.0)
MCV: 84 fL (ref 78.0–100.0)
MCV: 84.6 fL (ref 78.0–100.0)
Platelets: 214 10*3/uL (ref 150–400)
Platelets: 214 10*3/uL (ref 150–400)
RBC: 4.45 MIL/uL (ref 4.22–5.81)
RBC: 4.68 MIL/uL (ref 4.22–5.81)
RDW: 11.9 % (ref 11.5–15.5)
RDW: 11.9 % (ref 11.5–15.5)
WBC: 6.8 10*3/uL (ref 4.0–10.5)
WBC: 6.9 10*3/uL (ref 4.0–10.5)

## 2017-09-10 LAB — ETHANOL: ALCOHOL ETHYL (B): 253 mg/dL — AB (ref <10)

## 2017-09-10 LAB — RAPID URINE DRUG SCREEN, HOSP PERFORMED
Amphetamines: NOT DETECTED
BARBITURATES: NOT DETECTED
Benzodiazepines: NOT DETECTED
COCAINE: NOT DETECTED
Opiates: NOT DETECTED
TETRAHYDROCANNABINOL: NOT DETECTED

## 2017-09-10 LAB — MRSA PCR SCREENING: MRSA by PCR: NEGATIVE

## 2017-09-10 LAB — COMPREHENSIVE METABOLIC PANEL
ALK PHOS: 58 U/L (ref 38–126)
ALT: 16 U/L (ref 0–44)
ANION GAP: 10 (ref 5–15)
AST: 25 U/L (ref 15–41)
Albumin: 3.8 g/dL (ref 3.5–5.0)
BUN: 18 mg/dL (ref 8–23)
CALCIUM: 7.9 mg/dL — AB (ref 8.9–10.3)
CO2: 22 mmol/L (ref 22–32)
Chloride: 108 mmol/L (ref 98–111)
Creatinine, Ser: 0.96 mg/dL (ref 0.61–1.24)
GFR calc non Af Amer: 48 mL/min — ABNORMAL LOW (ref >60)
GFR, EST AFRICAN AMERICAN: 56 mL/min — AB (ref >60)
Glucose, Bld: 105 mg/dL — ABNORMAL HIGH (ref 70–99)
Potassium: 3.2 mmol/L — ABNORMAL LOW (ref 3.5–5.1)
SODIUM: 140 mmol/L (ref 135–145)
Total Bilirubin: 0.8 mg/dL (ref 0.3–1.2)
Total Protein: 6 g/dL — ABNORMAL LOW (ref 6.5–8.1)

## 2017-09-10 LAB — GLUCOSE, CAPILLARY
GLUCOSE-CAPILLARY: 81 mg/dL (ref 70–99)
Glucose-Capillary: 73 mg/dL (ref 70–99)

## 2017-09-10 LAB — BPAM RBC
BLOOD PRODUCT EXPIRATION DATE: 201909062359
BLOOD PRODUCT EXPIRATION DATE: 201909072359
ISSUE DATE / TIME: 201908062321
ISSUE DATE / TIME: 201908062321
UNIT TYPE AND RH: 9500
Unit Type and Rh: 9500

## 2017-09-10 LAB — SALICYLATE LEVEL: Salicylate Lvl: <7 mg/dL (ref 2.8–30.0)

## 2017-09-10 LAB — BLOOD PRODUCT ORDER (VERBAL) VERIFICATION

## 2017-09-10 LAB — BASIC METABOLIC PANEL
ANION GAP: 9 (ref 5–15)
BUN: 12 mg/dL (ref 8–23)
CO2: 19 mmol/L — AB (ref 22–32)
Calcium: 8 mg/dL — ABNORMAL LOW (ref 8.9–10.3)
Chloride: 113 mmol/L — ABNORMAL HIGH (ref 98–111)
Creatinine, Ser: 0.74 mg/dL (ref 0.61–1.24)
GFR calc Af Amer: >60 mL/min (ref >60)
GFR calc non Af Amer: 54 mL/min — ABNORMAL LOW (ref >60)
GLUCOSE: 85 mg/dL (ref 70–99)
POTASSIUM: 4.2 mmol/L (ref 3.5–5.1)
Sodium: 141 mmol/L (ref 135–145)

## 2017-09-10 LAB — BPAM FFP
Blood Product Expiration Date: 201908232359
Blood Product Expiration Date: 201908232359
ISSUE DATE / TIME: 201908062322
ISSUE DATE / TIME: 201908062322
Unit Type and Rh: 6200
Unit Type and Rh: 6200

## 2017-09-10 LAB — OSMOLALITY: OSMOLALITY: 330 mosm/kg — AB (ref 275–295)

## 2017-09-10 LAB — CDS SEROLOGY

## 2017-09-10 LAB — ACETAMINOPHEN LEVEL: Acetaminophen (Tylenol), Serum: <10 ug/mL — ABNORMAL LOW (ref 10–30)

## 2017-09-10 LAB — MAGNESIUM: Magnesium: 1.8 mg/dL (ref 1.7–2.4)

## 2017-09-10 LAB — PROTIME-INR
INR: 1.14
Prothrombin Time: 14.5 seconds (ref 11.4–15.2)

## 2017-09-10 LAB — PHOSPHORUS: Phosphorus: 2.7 mg/dL (ref 2.5–4.6)

## 2017-09-10 LAB — TRIGLYCERIDES: Triglycerides: 102 mg/dL (ref <150)

## 2017-09-10 LAB — TROPONIN I: Troponin I: <0.03 ng/mL (ref <0.03)

## 2017-09-10 MED ORDER — FENTANYL 2500MCG IN NS 250ML (10MCG/ML) PREMIX INFUSION: 25.0000 ug/h | INTRAVENOUS | Status: DC

## 2017-09-10 MED ORDER — SODIUM CHLORIDE 0.9 % IV SOLN
INTRAVENOUS | Status: DC
  Administered 2017-09-10: 02:00:00 via INTRAVENOUS

## 2017-09-10 MED ORDER — FENTANYL CITRATE (PF) 100 MCG/2ML IJ SOLN: 50.0000 ug | Freq: Once | INTRAMUSCULAR | Status: DC

## 2017-09-10 MED ORDER — PROPOFOL 1000 MG/100ML IV EMUL
5.0000 ug/kg/min | Freq: Once | INTRAVENOUS | Status: AC
  Administered 2017-09-10: 35 ug/kg/min via INTRAVENOUS

## 2017-09-10 MED ORDER — SODIUM CHLORIDE 0.9 % IV SOLN: 250.0000 mL | INTRAVENOUS | Status: DC | PRN

## 2017-09-10 MED ORDER — FOLIC ACID 5 MG/ML IJ SOLN
1.0000 mg | Freq: Every day | INTRAMUSCULAR | Status: DC
  Filled 2017-09-10: qty 0.2

## 2017-09-10 MED ORDER — POTASSIUM CHLORIDE 20 MEQ/15ML (10%) PO SOLN
40.0000 meq | Freq: Once | ORAL | Status: AC
  Administered 2017-09-10: 40 meq
  Filled 2017-09-10: qty 30

## 2017-09-10 MED ORDER — ONDANSETRON HCL 4 MG/2ML IJ SOLN: 4.0000 mg | Freq: Four times a day (QID) | INTRAMUSCULAR | Status: DC | PRN

## 2017-09-10 MED ORDER — ACETAMINOPHEN 160 MG/5ML PO SOLN
650.0000 mg | Freq: Four times a day (QID) | ORAL | Status: DC | PRN
Start: 2017-09-10 — End: 2017-09-10

## 2017-09-10 MED ORDER — SODIUM CHLORIDE 0.9 % IV SOLN
INTRAVENOUS | Status: AC | PRN
  Administered 2017-09-09 – 2017-09-10 (×2): 1000 mL via INTRAVENOUS

## 2017-09-10 MED ORDER — INSULIN ASPART 100 UNIT/ML ~~LOC~~ SOLN: 1.0000 [IU] | SUBCUTANEOUS | Status: DC

## 2017-09-10 MED ORDER — FENTANYL CITRATE (PF) 100 MCG/2ML IJ SOLN
100.0000 ug | INTRAMUSCULAR | Status: DC | PRN
  Filled 2017-09-10: qty 2

## 2017-09-10 MED ORDER — ACETAMINOPHEN 650 MG RE SUPP
650.0000 mg | Freq: Four times a day (QID) | RECTAL | Status: DC | PRN
Start: 2017-09-10 — End: 2017-09-10

## 2017-09-10 MED ORDER — MIDAZOLAM HCL 2 MG/2ML IJ SOLN
2.0000 mg | INTRAMUSCULAR | Status: DC | PRN
  Administered 2017-09-10 (×2): 2 mg via INTRAVENOUS
  Filled 2017-09-10: qty 2

## 2017-09-10 MED ORDER — ORAL CARE MOUTH RINSE
15.0000 mL | OROMUCOSAL | Status: DC
  Administered 2017-09-10: 15 mL via OROMUCOSAL

## 2017-09-10 MED ORDER — PROPOFOL 1000 MG/100ML IV EMUL: 0.0000 ug/kg/min | INTRAVENOUS | Status: DC

## 2017-09-10 MED ORDER — SODIUM CHLORIDE 0.9 % IV SOLN
1.0000 g | Freq: Once | INTRAVENOUS | Status: AC
  Administered 2017-09-10: 1 g via INTRAVENOUS
  Filled 2017-09-10: qty 10

## 2017-09-10 MED ORDER — PROPOFOL 1000 MG/100ML IV EMUL
0.0000 ug/kg/min | INTRAVENOUS | Status: DC
  Administered 2017-09-10: 30 ug/kg/min via INTRAVENOUS
  Filled 2017-09-10: qty 100

## 2017-09-10 MED ORDER — FENTANYL CITRATE (PF) 100 MCG/2ML IJ SOLN
100.0000 ug | INTRAMUSCULAR | Status: DC | PRN
  Administered 2017-09-10: 100 ug via INTRAVENOUS

## 2017-09-10 MED ORDER — FAMOTIDINE 40 MG/5ML PO SUSR
20.0000 mg | Freq: Two times a day (BID) | ORAL | Status: DC
  Filled 2017-09-10: qty 2.5

## 2017-09-10 MED ORDER — DOCUSATE SODIUM 50 MG/5ML PO LIQD
100.0000 mg | Freq: Two times a day (BID) | ORAL | Status: DC | PRN
  Filled 2017-09-10: qty 10

## 2017-09-10 MED ORDER — MIDAZOLAM HCL 2 MG/2ML IJ SOLN
2.0000 mg | INTRAMUSCULAR | Status: DC | PRN
  Filled 2017-09-10: qty 2

## 2017-09-10 MED ORDER — CHLORHEXIDINE GLUCONATE 0.12% ORAL RINSE (MEDLINE KIT)
15.0000 mL | Freq: Two times a day (BID) | OROMUCOSAL | Status: DC
  Administered 2017-09-10: 15 mL via OROMUCOSAL

## 2017-09-10 MED ORDER — HEPARIN SODIUM (PORCINE) 5000 UNIT/ML IJ SOLN
5000.0000 [IU] | Freq: Three times a day (TID) | INTRAMUSCULAR | Status: DC
  Administered 2017-09-10: 5000 [IU] via SUBCUTANEOUS
  Filled 2017-09-10: qty 1

## 2017-09-10 MED ORDER — FENTANYL BOLUS VIA INFUSION: 50.0000 ug | INTRAVENOUS | Status: DC | PRN

## 2017-09-10 MED ORDER — THIAMINE HCL 100 MG/ML IJ SOLN: 100.0000 mg | Freq: Every day | INTRAMUSCULAR | Status: DC

## 2017-09-10 NOTE — Procedures (Signed)
Extubation Procedure Note  Patient Details:   Name: Jordan Gomez DOB: 02/04/1873 MRN: [id number]   Airway Documentation:  Airway 7.5 mm (Active)  Secured at (cm) 23 cm 09/10/2017  7:16 AM  Measured From Lips 09/10/2017  7:16 AM  Secured Location Center 09/10/2017  7:16 AM  Secured By [employer] Tube Holder 09/10/2017  7:16 AM  Tube Holder Repositioned Yes 09/10/2017  7:16 AM  Cuff Pressure (cm H2O) 30 cm H2O 09/10/2017  7:16 AM  Site Condition Dry 09/10/2017  7:16 AM   Vent end date: (not recorded) Vent end time: (not recorded)   Evaluation  O2 sats: stable throughout Complications: No apparent complications Patient did tolerate procedure well. Bilateral Breath Sounds: (P) Clear   Yes   Patient extubated per order to 2L [place] with no apparent complications. Positive cuff leak was noted prior to extubation. Patient is alert and oriented. Patient is able to speak as he is screaming and yelling and cursing at everyone in the room. Vitals are stable. RT will continue to monitor.   Albirtha Grinage Lajuana RippleM Jourdyn Ferrin 09/10/2017, 9:04 AM

## 2017-09-10 NOTE — Consult Note (Signed)
Surgical Consultation Requesting provider: Dr. Nicanor Alcon  CC: altered level of consciousness  HPI: Young man with unclear history- taken from other providers and EMS as patient arrives unconscious. He was behaving erratically in the road on [address] and was brought to the jail by the police. This was around 9pm. He was apparently combative and rude. At some point, he stated he needed to vomit. He was given a trash can which he kicked aside and vomited on the floor, immediatell after which he lost consciousness. Sternal rub was attempted without response. At this time EMS was contacted and he was brought to [facility] ER. He is GCS 3 on arrival and mildly hypotensive (90s/50s), no tachycardia.   Unable to ascertain allergies, medications, medical/surgical/social/family history at this time due to patient unresponsive.   Social History        Socioeconomic History  . Marital status: Not on file    Spouse name: Not on file  . Number of children: Not on file  . Years of education: Not on file  . Highest education level: Not on file  Occupational History  . Not on file  Social Needs  . Financial resource strain: Not on file  . Food insecurity:    Worry: Not on file    Inability: Not on file  . Transportation needs:    Medical: Not on file    Non-medical: Not on file  Tobacco Use  . Smoking status: Not on file  Substance and Sexual Activity  . Alcohol use: Not on file  . Drug use: Not on file  . Sexual activity: Not on file  Lifestyle  . Physical activity:    Days per week: Not on file    Minutes per session: Not on file  . Stress: Not on file  Relationships  . Social connections:    Talks on phone: Not on file    Gets together: Not on file    Attends religious service: Not on file    Active member of club or organization: Not on file    Attends meetings of clubs or organizations: Not on file    Relationship status: Not on file  Other Topics Concern  .  Not on file  Social History Narrative  . Not on file    No current facility-administered medications on file prior to encounter.    No current outpatient medications on file prior to encounter.    Review of Systems: a complete, 10pt review of systems was unable to be completed due to patient mental status  Physical Exam: Vitals:   09/10/17 0030 09/10/17 0040  BP: 123/85 123/77  Pulse: 60 91  Resp: (!) 21 (!) 24  Temp:    SpO2: 100% 99%   Gen: Intubated Head: normocephalic, atraumatic. No evidence of trauma Eyes: sclera injected. Pupils were 10mm on arrival per EDP, now about 4mm Neck: trachea midline, no crepitus or hematoma Chest: symmetrical air entry, clear bilaterally. There is a superficial abrasion to the sternum, reportedly from sternal rubbing  Cardiovascular: RRR with palpable distal pulses, no pedal edema Abdomen: soft, nondistended. No mass or organomegaly.  Extremities: warm, without edema, no deformities. No crepitus or swelling along long bones or hands/feet Neuro: GCS 3 Psych: unable to assess due to mental status Skin: warm and dry   CBC Latest Ref Rng & Units 09/10/2017 09/09/2017  WBC 4.0 - 10.5 K/uL - 6.8  Hemoglobin 13.0 - 17.0 g/dL 16.1 09.6  Hematocrit 04.5 - 52.0 % 40.0 39.6  Platelets 150 - 400 K/uL - 214    CMP Latest Ref Rng & Units 09/10/2017  Glucose 70 - 99 mg/dL 98  BUN 8 - 23 mg/dL 21  Creatinine 1.61 - 0.96 mg/dL 0.45(W)  Sodium 098 - 119 mmol/L 142  Potassium 3.5 - 5.1 mmol/L 3.3(L)  Chloride 98 - 111 mmol/L 107    RecentLabs       Lab Results  Component Value Date   INR 1.14 09/09/2017      Imaging:  ImagingResults(Last48hours)  Ct Head Wo Contrast  Result Date: 09/10/2017 CLINICAL DATA:  Patient found down. Vomiting. Concern for head or cervical spine injury. EXAM: CT HEAD WITHOUT CONTRAST CT CERVICAL SPINE WITHOUT CONTRAST TECHNIQUE: Multidetector CT imaging of the head and cervical spine was  performed following the standard protocol without intravenous contrast. Multiplanar CT image reconstructions of the cervical spine were also generated. COMPARISON:  None. FINDINGS: CT HEAD FINDINGS Brain: No evidence of acute infarction, hemorrhage, hydrocephalus, extra-axial collection or mass lesion/mass effect. The posterior fossa, including the cerebellum, brainstem and fourth ventricle, is within normal limits. The third and lateral ventricles, and basal ganglia are unremarkable in appearance. The cerebral hemispheres are symmetric in appearance, with normal gray-white differentiation. No mass effect or midline shift is seen. Vascular: No hyperdense vessel or unexpected calcification. Skull: There is no evidence of fracture; visualized osseous structures are unremarkable in appearance. Sinuses/Orbits: The visualized portions of the orbits are within normal limits. The paranasal sinuses and mastoid air cells are well-aerated. Other: No significant soft tissue abnormalities are seen. CT CERVICAL SPINE FINDINGS Alignment: Normal. Skull base and vertebrae: No acute fracture. No primary bone lesion or focal pathologic process. Soft tissues and spinal canal: No prevertebral fluid or swelling. No visible canal hematoma. Disc levels: Intervertebral disc spaces are preserved. The bony foramina are grossly unremarkable Upper chest: The visualized lung apices are clear. The thyroid gland is unremarkable. The endotracheal tube and nasogastric tube are partially visualized. Other: No additional soft tissue abnormalities are seen. IMPRESSION: 1. No evidence of traumatic intracranial injury or fracture. 2. No evidence of fracture or subluxation along the cervical spine. Electronically Signed   By: Roanna Raider M.D.   On: 09/10/2017 00:32   Ct Chest W Contrast  Result Date: 09/10/2017 CLINICAL DATA:  Patient found down in became combative when being transported jail. Patient vomited at the scene. EXAM: CT CHEST,  ABDOMEN, AND PELVIS WITH CONTRAST TECHNIQUE: Multidetector CT imaging of the chest, abdomen and pelvis was performed following the standard protocol during bolus administration of intravenous contrast. CONTRAST:  ISOVUE-300 IOPAMIDOL (ISOVUE-300) INJECTION 61% COMPARISON:  None. FINDINGS: CT CHEST FINDINGS CARDIOVASCULAR: Conventional branch pattern of the great vessels without stenosis. Nonaneurysmal thoracic aorta. No large central pulmonary embolus. Normal heart size without pericardial effusion. MEDIASTINUM/NODES: Unremarkable thyroid gland. No lymphadenopathy by CT size criteria. Patent trachea and mainstem bronchi. Endotracheal tube tip terminates at the level of the aortic arch. Gastric tube is noted in the esophagus extending into the stomach. CT appearance of the esophagus is unremarkable. No hilar lymphadenopathy is noted. LUNGS/PLEURA: Minimal bibasilar atelectasis more so along the medial aspect of the left lower lobe and posterior aspect of the right lower lobe. No effusion or pneumothorax. No dominant mass. MUSCULOSKELETAL: The included soft tissues and osseous structures appear intact. No acute osseous appearing abnormality is noted. CT ABDOMEN AND PELVIS FINDINGS HEPATIC BILIARY: Hepatomegaly with a liver measuring up to 18.9 cm midclavicular line with periportal edema, question hepatic injury from toxins,  viral or alcoholic hepatitis among some considerations. No space-occupying mass of the liver. The gallbladder is physiologically distended without stones. PANCREAS: No pancreatic mass, inflammation or ductal dilatation. SPLEEN: Normal size spleen without mass. ADRENALS/URINARY TRACT: Normal bilateral adrenal glands and kidneys. No enhancing mass lesion of either kidney. No nephrolithiasis nor obstructive uropathy. The ureters are not dilated. No hydroureteronephrosis is noted. The bladder is unremarkable for the degree of distention without focal mural thickening or calculi. STOMACH/BOWEL:  The stomach, small and large bowel are normal in course and caliber without mural thickening or inflammation. No mechanical bowel obstruction is noted. The increased colonic stool burden. Normal air-filled appendix. VASCULAR/LYMPHATIC: Nonaneurysmal thoracic aorta. No lymphadenopathy by CT size criteria. REPRODUCTIVE: Normal prostate and seminal vesicles. OTHER: No free air nor free fluid. MUSCULOSKELETAL: Nonacute. IMPRESSION: CT chest: 1. Satisfactory support line and tube positions with endotracheal tube tip terminating at the level of the aortic arch, above the carina and gastric tube extending into the stomach. 2. No active cardiopulmonary disease. Bibasilar atelectasis is noted. 3. No large central pulmonary embolus. 4. Unremarkable aorta without dissection or aneurysm. CT AP: 1. Hepatomegaly with periportal edema, nonspecific but can be seen in hepatitis possibly alcoholic, viral or secondary to toxins. 2. Increased colonic stool burden query constipation. No bowel obstruction or inflammation. Electronically Signed   By: Tollie Eth M.D.   On: 09/10/2017 00:36   Ct Cervical Spine Wo Contrast  Result Date: 09/10/2017 CLINICAL DATA:  Patient found down. Vomiting. Concern for head or cervical spine injury. EXAM: CT HEAD WITHOUT CONTRAST CT CERVICAL SPINE WITHOUT CONTRAST TECHNIQUE: Multidetector CT imaging of the head and cervical spine was performed following the standard protocol without intravenous contrast. Multiplanar CT image reconstructions of the cervical spine were also generated. COMPARISON:  None. FINDINGS: CT HEAD FINDINGS Brain: No evidence of acute infarction, hemorrhage, hydrocephalus, extra-axial collection or mass lesion/mass effect. The posterior fossa, including the cerebellum, brainstem and fourth ventricle, is within normal limits. The third and lateral ventricles, and basal ganglia are unremarkable in appearance. The cerebral hemispheres are symmetric in appearance, with normal  gray-white differentiation. No mass effect or midline shift is seen. Vascular: No hyperdense vessel or unexpected calcification. Skull: There is no evidence of fracture; visualized osseous structures are unremarkable in appearance. Sinuses/Orbits: The visualized portions of the orbits are within normal limits. The paranasal sinuses and mastoid air cells are well-aerated. Other: No significant soft tissue abnormalities are seen. CT CERVICAL SPINE FINDINGS Alignment: Normal. Skull base and vertebrae: No acute fracture. No primary bone lesion or focal pathologic process. Soft tissues and spinal canal: No prevertebral fluid or swelling. No visible canal hematoma. Disc levels: Intervertebral disc spaces are preserved. The bony foramina are grossly unremarkable Upper chest: The visualized lung apices are clear. The thyroid gland is unremarkable. The endotracheal tube and nasogastric tube are partially visualized. Other: No additional soft tissue abnormalities are seen. IMPRESSION: 1. No evidence of traumatic intracranial injury or fracture. 2. No evidence of fracture or subluxation along the cervical spine. Electronically Signed   By: Roanna Raider M.D.   On: 09/10/2017 00:32   Ct Abdomen Pelvis W Contrast  Result Date: 09/10/2017 CLINICAL DATA:  Patient found down in became combative when being transported jail. Patient vomited at the scene. EXAM: CT CHEST, ABDOMEN, AND PELVIS WITH CONTRAST TECHNIQUE: Multidetector CT imaging of the chest, abdomen and pelvis was performed following the standard protocol during bolus administration of intravenous contrast. CONTRAST:  ISOVUE-300 IOPAMIDOL (ISOVUE-300) INJECTION 61% COMPARISON:  None. FINDINGS: CT CHEST FINDINGS CARDIOVASCULAR: Conventional branch pattern of the great vessels without stenosis. Nonaneurysmal thoracic aorta. No large central pulmonary embolus. Normal heart size without pericardial effusion. MEDIASTINUM/NODES: Unremarkable thyroid gland. No  lymphadenopathy by CT size criteria. Patent trachea and mainstem bronchi. Endotracheal tube tip terminates at the level of the aortic arch. Gastric tube is noted in the esophagus extending into the stomach. CT appearance of the esophagus is unremarkable. No hilar lymphadenopathy is noted. LUNGS/PLEURA: Minimal bibasilar atelectasis more so along the medial aspect of the left lower lobe and posterior aspect of the right lower lobe. No effusion or pneumothorax. No dominant mass. MUSCULOSKELETAL: The included soft tissues and osseous structures appear intact. No acute osseous appearing abnormality is noted. CT ABDOMEN AND PELVIS FINDINGS HEPATIC BILIARY: Hepatomegaly with a liver measuring up to 18.9 cm midclavicular line with periportal edema, question hepatic injury from toxins, viral or alcoholic hepatitis among some considerations. No space-occupying mass of the liver. The gallbladder is physiologically distended without stones. PANCREAS: No pancreatic mass, inflammation or ductal dilatation. SPLEEN: Normal size spleen without mass. ADRENALS/URINARY TRACT: Normal bilateral adrenal glands and kidneys. No enhancing mass lesion of either kidney. No nephrolithiasis nor obstructive uropathy. The ureters are not dilated. No hydroureteronephrosis is noted. The bladder is unremarkable for the degree of distention without focal mural thickening or calculi. STOMACH/BOWEL: The stomach, small and large bowel are normal in course and caliber without mural thickening or inflammation. No mechanical bowel obstruction is noted. The increased colonic stool burden. Normal air-filled appendix. VASCULAR/LYMPHATIC: Nonaneurysmal thoracic aorta. No lymphadenopathy by CT size criteria. REPRODUCTIVE: Normal prostate and seminal vesicles. OTHER: No free air nor free fluid. MUSCULOSKELETAL: Nonacute. IMPRESSION: CT chest: 1. Satisfactory support line and tube positions with endotracheal tube tip terminating at the level of the aortic arch,  above the carina and gastric tube extending into the stomach. 2. No active cardiopulmonary disease. Bibasilar atelectasis is noted. 3. No large central pulmonary embolus. 4. Unremarkable aorta without dissection or aneurysm. CT AP: 1. Hepatomegaly with periportal edema, nonspecific but can be seen in hepatitis possibly alcoholic, viral or secondary to toxins. 2. Increased colonic stool burden query constipation. No bowel obstruction or inflammation. Electronically Signed   By: Tollie Eth M.D.   On: 09/10/2017 00:36   Dg Chest Port 1 View  Result Date: 09/09/2017 CLINICAL DATA:  Trauma patient. EXAM: PORTABLE CHEST 1 VIEW COMPARISON:  No priors. FINDINGS: An endotracheal tube is in place with tip 1.3 cm above the carina. Nasogastric tube extends into the stomach. Lung volumes are low. No consolidative airspace disease. No pleural effusions. No pneumothorax. No pulmonary nodule or mass noted. Pulmonary vasculature and the cardiomediastinal silhouette are within normal limits. IMPRESSION: 1. Support apparatus, as above. Endotracheal tube is in a low position and could be withdrawn 3 cm for more optimal placement. 2. Low lung volumes without radiographic evidence of acute cardiopulmonary disease. Electronically Signed   By: Trudie Reed M.D.   On: 09/09/2017 23:59       A/P: Young man likely intoxicated, possible aspiration, unclear what events transpired before he was taken into custody. There is no evidence of traumatic injury on his physical exam or chest xray. CT head, c-spine and chest/abdomen/pelvis are negative for traumatic injury, but do reveal constipation and hepatomegaly/periportal edema of undetermined etiology.  Recommend medical admission for resuscitation and ongoing evaluation. Trauma will sign off. Please contact us if we can be of any help.    Phylliss Blakes, MD [facility]  [place] Pager [phone]

## 2017-09-10 NOTE — Progress Notes (Signed)
eLink Physician-Brief Progress Note Patient Name: Jordan Gomez [name] DOB: 02/04/1873 MRN: [id number]   Date of Service  09/10/2017  HPI/Events of Note  Called re: serum osm 330. Osmolar gap of > 30. This is likely due to ethanol. No definite history of ingestion  eICU Interventions  Volatile alcohols ordered Recheck osmolality @ 12N     Intervention Category Major Interventions: Other:  Merwyn KatosDavid B Simonds 09/10/2017, 6:52 AM

## 2017-09-10 NOTE — ED Provider Notes (Signed)
[facility] EMERGENCY DEPARTMENT Provider Note   CSN: [id number] Arrival date & time: 09/09/17  2315     History   Chief Complaint Chief Complaint  Patient presents with  . Altered Mental Status    HPI Jordan Gomez is a 27 y.o. male.  The history is provided by the EMS personnel and the police. The history is limited by the condition of the patient (unresponsive ).  Altered Mental Status   This is a new problem. The current episode started 3 to 5 hours ago. The problem has not changed since onset.Associated symptoms include unresponsiveness. Risk factors: unknown. His past medical history does not include dementia.  Alternating agitation and unresponsiveness per [employer], found in the street.  Patient was reportedly taken to jail.  Decreased LOC and emesis at prison.  Unknown trauma.  Unknown substance use.  No reported history.      History reviewed. No pertinent past medical history.  There are no active problems to display for this patient.   History reviewed. No pertinent surgical history.      Home Medications    Prior to Admission medications   Not on File    Family History No family history on file.  Social History Social History   Tobacco Use  . Smoking status: Not on file  Substance Use Topics  . Alcohol use: Not on file  . Drug use: Not on file     Allergies   Patient has no allergy information on record.   Review of Systems Review of Systems  Unable to perform ROS: Mental status change  Constitutional: Negative for fever.  Gastrointestinal: Positive for vomiting.  Skin: Positive for wound.     Physical Exam Updated Vital Signs BP (!) 131/95 (BP Location: Left Arm)   Pulse 77   Temp (!) 96.8 F (36 C) (Temporal)   Resp 15   Ht 5\' 9"  (1.753 m)   Wt 68 kg (150 lb)   SpO2 100%   BMI 22.15 kg/m   Physical Exam  HENT:  Head: Normocephalic and atraumatic. Head is without raccoon's eyes and without Battle's sign.    Right Ear: No mastoid tenderness. No hemotympanum.  Left Ear: No mastoid tenderness. No hemotympanum.  Nose: Nose normal.  Eyes: Conjunctivae are normal.  10 mm non reactive  Neck: No tracheal deviation present.  c collar placed  Cardiovascular: Normal rate, regular rhythm, normal heart sounds and intact distal pulses.  Pulmonary/Chest: He has no wheezes. He has no rales.  Abrasion over sternum  Abdominal: Soft. Bowel sounds are normal. He exhibits no mass. There is no tenderness. No hernia.  Genitourinary: Penis normal.  Musculoskeletal: He exhibits no deformity.  Neurological: He displays normal reflexes. GCS eye subscore is 1. GCS verbal subscore is 1. GCS motor subscore is 1.  Skin: Skin is warm and dry. Capillary refill takes less than 2 seconds. He is not diaphoretic.  Psychiatric:  unable  Nursing note and vitals reviewed.    ED Treatments / Results  Labs (all labs ordered are listed, but only abnormal results are displayed) Labs Reviewed  CDS SEROLOGY  COMPREHENSIVE METABOLIC PANEL  CBC  ETHANOL  URINALYSIS, ROUTINE W REFLEX MICROSCOPIC  PROTIME-INR  ACETAMINOPHEN LEVEL  SALICYLATE LEVEL  I-STAT CHEM 8, ED  I-STAT CG4 LACTIC ACID, ED  I-STAT TROPONIN, ED  CBG MONITORING, ED  I-STAT CHEM 8, ED  TYPE AND SCREEN  PREPARE FRESH FROZEN PLASMA  SAMPLE TO BLOOD BANK  EKG None  Radiology Dg Chest Port 1 View  Result Date: 09/09/2017 CLINICAL DATA:  Trauma patient. EXAM: PORTABLE CHEST 1 VIEW COMPARISON:  No priors. FINDINGS: An endotracheal tube is in place with tip 1.3 cm above the carina. Nasogastric tube extends into the stomach. Lung volumes are low. No consolidative airspace disease. No pleural effusions. No pneumothorax. No pulmonary nodule or mass noted. Pulmonary vasculature and the cardiomediastinal silhouette are within normal limits. IMPRESSION: 1. Support apparatus, as above. Endotracheal tube is in a low position and could be withdrawn 3 cm for more  optimal placement. 2. Low lung volumes without radiographic evidence of acute cardiopulmonary disease. Electronically Signed   By: Trudie Reedaniel  Entrikin M.D.   On: 09/09/2017 23:59    Procedures Procedure Name: Intubation Date/Time: 09/10/2017 12:22 AM Performed by: Cy BlamerPalumbo, Thanh Mottern, MD Pre-anesthesia Checklist: Patient identified, Patient being monitored, Emergency Drugs available, Timeout performed and Suction available Oxygen Delivery Method: Non-rebreather mask Preoxygenation: Pre-oxygenation with 100% oxygen Induction Type: Rapid sequence Ventilation: Mask ventilation without difficulty Laryngoscope Size: Glidescope and 4 Grade View: Grade I Tube size: 7.5 mm Number of attempts: 1 Placement Confirmation: ETT inserted through vocal cords under direct vision,  CO2 detector and Breath sounds checked- equal and bilateral Tube secured with: ETT holder Dental Injury: Teeth and Oropharynx as per pre-operative assessment       (including critical care time)  Medications Ordered in ED Medications  iopamidol (ISOVUE-300) 61 % injection 100 mL (has no administration in time range)  etomidate (AMIDATE) injection (20 mg Intravenous Given 09/09/17 2319)  rocuronium (ZEMURON) injection (100 mg Intravenous Given 09/09/17 2320)  0.9 %  sodium chloride infusion (1,000 mLs Intravenous New Bag/Given 09/10/17 0000)  propofol (DIPRIVAN) 1000 MG/100ML infusion (5 mcg/kg/min  New Bag/Given 09/09/17 2335)     MDM Reviewed: nursing note and vitals Interpretation: labs, ECG, x-ray and CT scan (negative tox panel, positive ETOH. ETT in good position on XR, negative head ct ) Total time providing critical care: > 105 minutes. This excludes time spent performing separately reportable procedures and services. Consults: critical care and trauma  CRITICAL CARE Performed by: Jasmine AwePALUMBO-RASCH,Yonael Tulloch K Total critical care time: 120  minutes Critical care time was exclusive of separately billable procedures and treating  other patients. Critical care was necessary to treat or prevent imminent or life-threatening deterioration. Critical care was time spent personally by me on the following activities: development of treatment plan with patient and/or surrogate as well as nursing, discussions with consultants, evaluation of patient's response to treatment, examination of patient, obtaining history from patient or surrogate, ordering and performing treatments and interventions, ordering and review of laboratory studies, ordering and review of radiographic studies, pulse oximetry and re-evaluation of patient's condition.   Final Clinical Impressions(s) / ED Diagnoses   Will admit to ICU, patient had dilated pupils and given ETOH is not that high I suspect an ingestion I cannot test for in the ED.      Shannell Mikkelsen, MD 09/10/17 [phone]

## 2017-09-10 NOTE — Progress Notes (Signed)
CRITICAL VALUE ALERT  Critical Value:  Serum Osmolality 330   Date & Time Notied:  09/10/17 0627  Provider Notified: Dr. Bard HerbertSimmonds  Orders Received/Actions taken: No new orders given at this time.
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
## 2017-09-10 NOTE — Progress Notes (Signed)
   09/10/17 0100  Clinical Encounter Type  Visited With Patient  Visit Type Initial  Referral From Nurse  Consult/Referral To Chaplain    Pt was being intubated when I arrived. A number of times I tried inquiring for any information about the pt from both the nurse and police  in vain. Chaplain provided compassionate presence. Jordan Gomez, E. I. du PontChaplain

## 2017-09-10 NOTE — Progress Notes (Signed)
Discharge Planning:  Reviewed case, no NCM needs identified. Pt listed a potential Code 44, discontinued. Will dc in Police Custody. Isidoro DonningAlesia Sailor Haughn RN CCM Case Mgmt phone [phone]
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
## 2017-09-11 LAB — HEPATITIS PANEL, ACUTE
HCV Ab: <0.1 s/co ratio (ref 0.0–0.9)
HEP B C IGM: NEGATIVE
HEP B S AG: NEGATIVE
Hep A IgM: NEGATIVE

## 2017-09-15 LAB — CULTURE, BLOOD (ROUTINE X 2)
CULTURE: NO GROWTH
Culture: NO GROWTH
Special Requests: ADEQUATE
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
